# Patient Record
Sex: Male | Born: 1985 | Marital: Single | Smoking: Former smoker
Health system: Southern US, Community
[De-identification: ages and names within clinical notes are randomized; demographics above are authoritative.]

---

## 2019-11-12 ENCOUNTER — Emergency Department (HOSPITAL_COMMUNITY): Payer: Self-pay

## 2019-11-12 ENCOUNTER — Encounter (HOSPITAL_COMMUNITY): Payer: Self-pay | Admitting: Emergency Medicine

## 2019-11-12 ENCOUNTER — Other Ambulatory Visit: Payer: Self-pay

## 2019-11-12 ENCOUNTER — Inpatient Hospital Stay (HOSPITAL_COMMUNITY)
Admission: EM | Admit: 2019-11-12 | Discharge: 2019-11-16 | DRG: 060 | Disposition: A | Discharge status: Home / Self Care | Payer: Self-pay | Attending: Internal Medicine | Admitting: Internal Medicine

## 2019-11-12 DIAGNOSIS — R200 Anesthesia of skin: Secondary | ICD-10-CM

## 2019-11-12 DIAGNOSIS — R270 Ataxia, unspecified: Secondary | ICD-10-CM

## 2019-11-12 DIAGNOSIS — G35 Multiple sclerosis: Principal | ICD-10-CM | POA: Diagnosis present

## 2019-11-12 DIAGNOSIS — Z833 Family history of diabetes mellitus: Secondary | ICD-10-CM

## 2019-11-12 DIAGNOSIS — D72829 Elevated white blood cell count, unspecified: Secondary | ICD-10-CM

## 2019-11-12 DIAGNOSIS — H539 Unspecified visual disturbance: Secondary | ICD-10-CM

## 2019-11-12 DIAGNOSIS — F17210 Nicotine dependence, cigarettes, uncomplicated: Secondary | ICD-10-CM | POA: Diagnosis present

## 2019-11-12 DIAGNOSIS — R001 Bradycardia, unspecified: Secondary | ICD-10-CM | POA: Diagnosis not present

## 2019-11-12 DIAGNOSIS — H5500 Unspecified nystagmus: Secondary | ICD-10-CM | POA: Diagnosis present

## 2019-11-12 DIAGNOSIS — Z20822 Contact with and (suspected) exposure to covid-19: Secondary | ICD-10-CM | POA: Diagnosis present

## 2019-11-12 DIAGNOSIS — R739 Hyperglycemia, unspecified: Secondary | ICD-10-CM

## 2019-11-12 DIAGNOSIS — Z720 Tobacco use: Secondary | ICD-10-CM

## 2019-11-12 DIAGNOSIS — T380X5A Adverse effect of glucocorticoids and synthetic analogues, initial encounter: Secondary | ICD-10-CM | POA: Diagnosis present

## 2019-11-12 LAB — URINALYSIS, ROUTINE W REFLEX MICROSCOPIC
Bilirubin Urine: NEGATIVE
Glucose, UA: NEGATIVE mg/dL
Hgb urine dipstick: NEGATIVE
Ketones, ur: NEGATIVE mg/dL
Leukocytes,Ua: NEGATIVE
Nitrite: NEGATIVE
Protein, ur: NEGATIVE mg/dL
Specific Gravity, Urine: 1.027 (ref 1.005–1.030)
pH: 6 (ref 5.0–8.0)

## 2019-11-12 LAB — CBC
HCT: 53.8 % — ABNORMAL HIGH (ref 39.0–52.0)
Hemoglobin: 17.5 g/dL — ABNORMAL HIGH (ref 13.0–17.0)
MCH: 29.3 pg (ref 26.0–34.0)
MCHC: 32.5 g/dL (ref 30.0–36.0)
MCV: 90.1 fL (ref 80.0–100.0)
Platelets: 166 10*3/uL (ref 150–400)
RBC: 5.97 MIL/uL — ABNORMAL HIGH (ref 4.22–5.81)
RDW: 14.1 % (ref 11.5–15.5)
WBC: 12.3 10*3/uL — ABNORMAL HIGH (ref 4.0–10.5)
nRBC: 0 % (ref 0.0–0.2)

## 2019-11-12 LAB — BASIC METABOLIC PANEL
Anion gap: 10 (ref 5–15)
BUN: 20 mg/dL (ref 6–20)
CO2: 23 mmol/L (ref 22–32)
Calcium: 9 mg/dL (ref 8.9–10.3)
Chloride: 105 mmol/L (ref 98–111)
Creatinine, Ser: 0.97 mg/dL (ref 0.61–1.24)
GFR calc Af Amer: >60 mL/min (ref >60)
GFR calc non Af Amer: >60 mL/min (ref >60)
Glucose, Bld: 106 mg/dL — ABNORMAL HIGH (ref 70–99)
Potassium: 4 mmol/L (ref 3.5–5.1)
Sodium: 138 mmol/L (ref 135–145)

## 2019-11-12 LAB — CBG MONITORING, ED: Glucose-Capillary: 99 mg/dL (ref 70–99)

## 2019-11-12 IMAGING — MR MR HEAD W/ CM
3 series · 30 of 48 positions shown · IV contrast (gadavist)
Comparison: Noncontrast brain MRI earlier today.

CLINICAL DATA: 33-year-old male with ataxia, visual changes, right
facial droop, numbness. Noncontrast MRI earlier today suspicious for
demyelinating disease, including abnormal signal in the dorsal
brainstem.

EXAM:
MRI HEAD WITH CONTRAST
TECHNIQUE: Multiplanar, multiecho pulse sequences of the brain and surrounding
structures were obtained with intravenous contrast.
CONTRAST:  10mL GADAVIST GADOBUTROL 1 MMOL/ML IV SOLN

[Series 12: T1 post-contrast · axial · 3.0mm · 0.45mm/px · z∈[-44,+74]mm · 11 of 47 slices shown (1 of 3)]
[im 3/47]
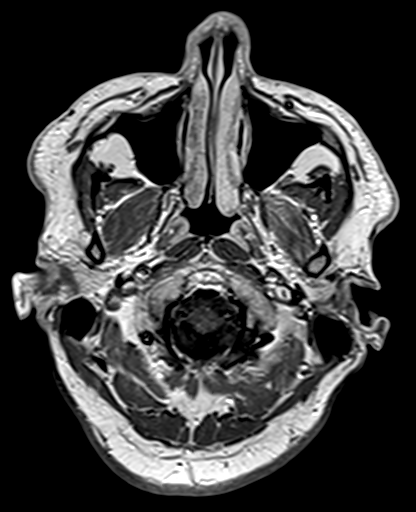
[im 7/47]
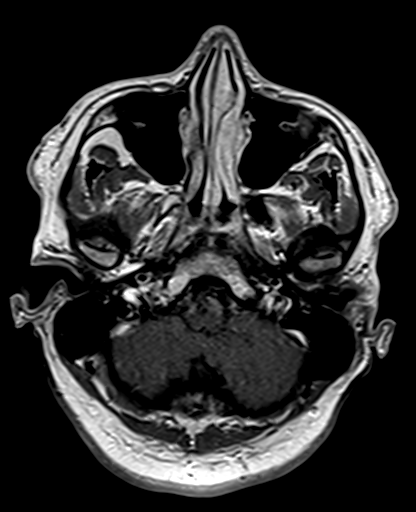
[im 9/47]
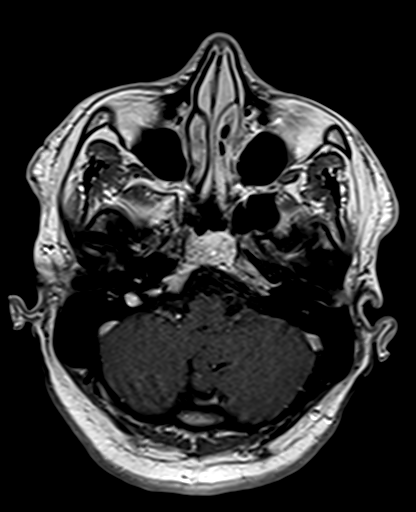
[im 15/47]
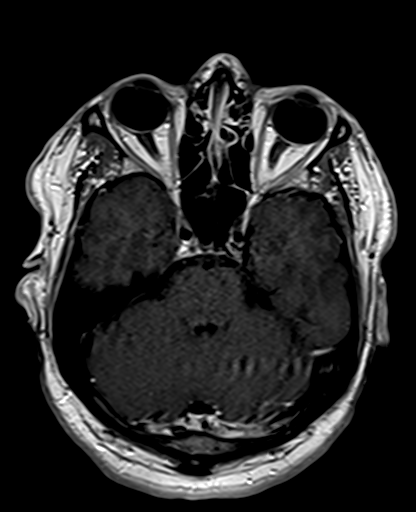
[im 21/47]
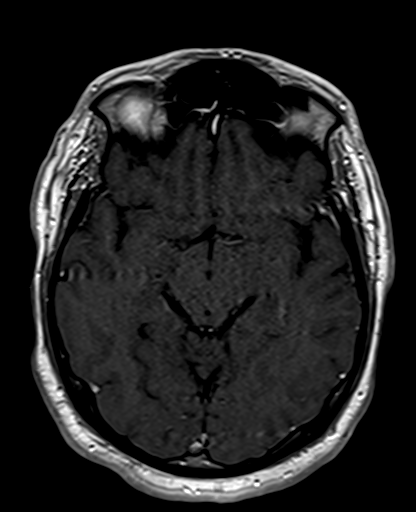
[im 24/47]
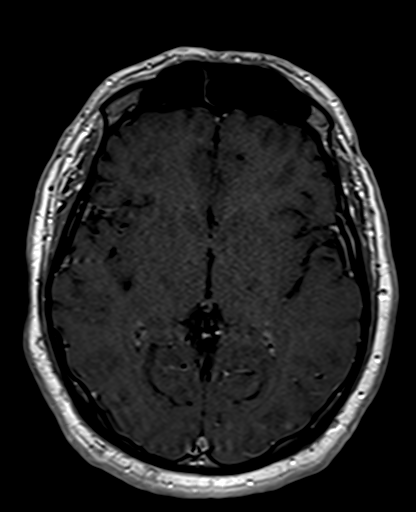
[im 26/47]
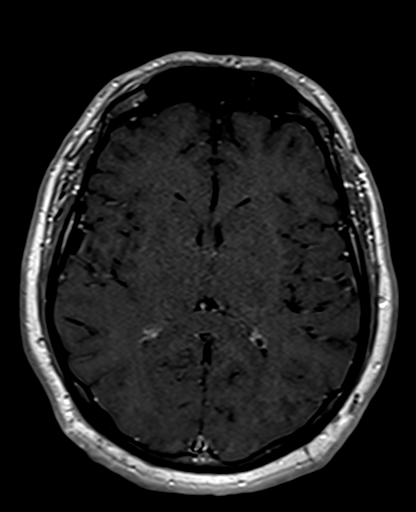
[im 32/47]
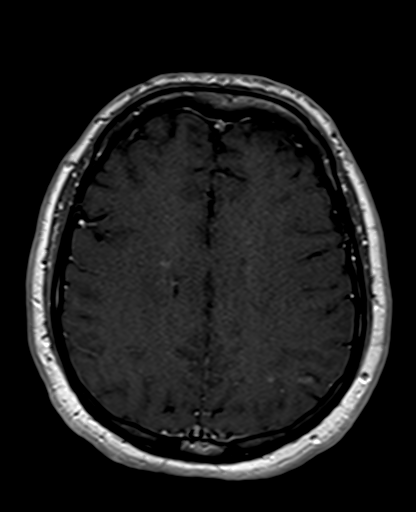
[im 38/47]
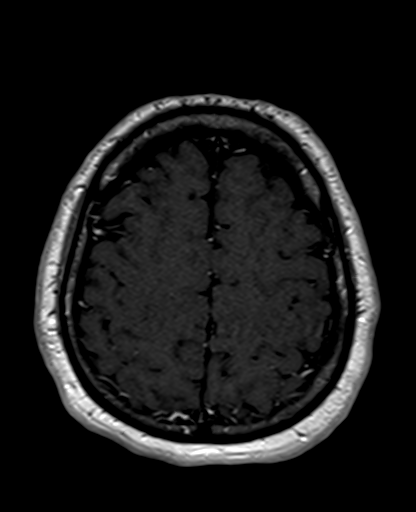
[im 40/47]
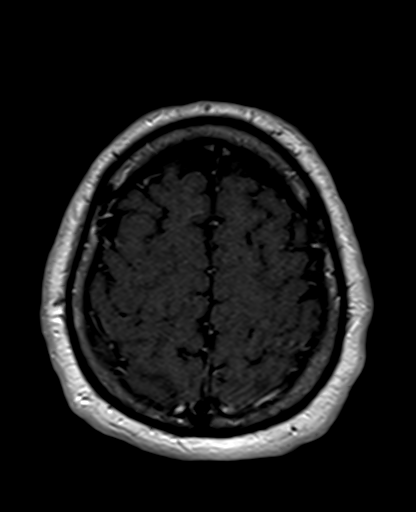
[im 44/47]
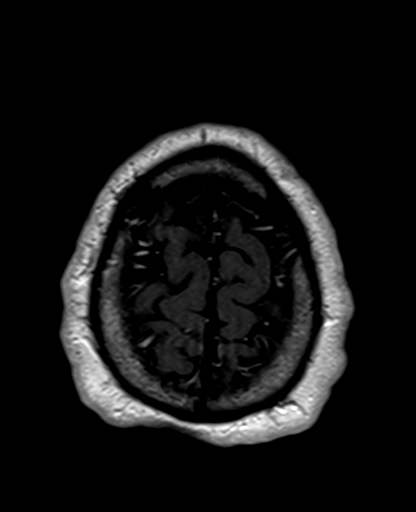

[Series 13: T1 post-contrast · coronal · 5.0mm · 0.43mm/px · 8 of 30 slices shown (2 of 3)]
[im 1/30]
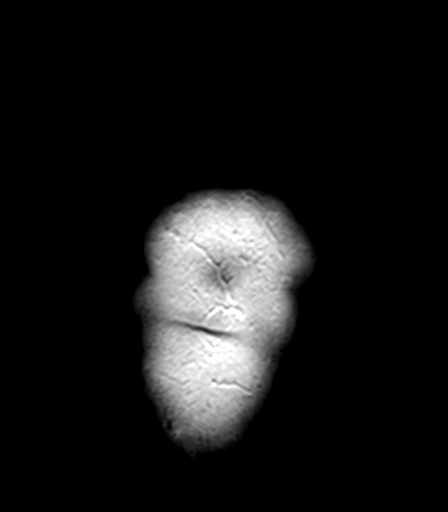
[im 5/30]
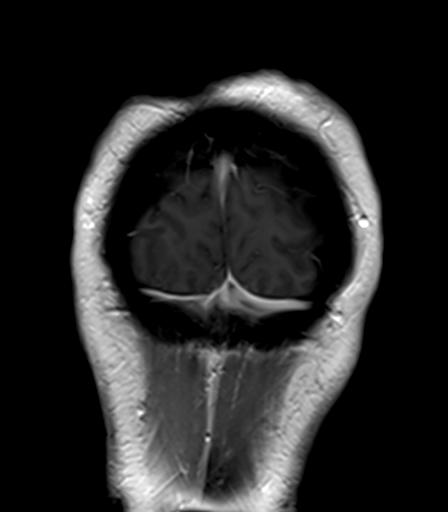
[im 9/30]
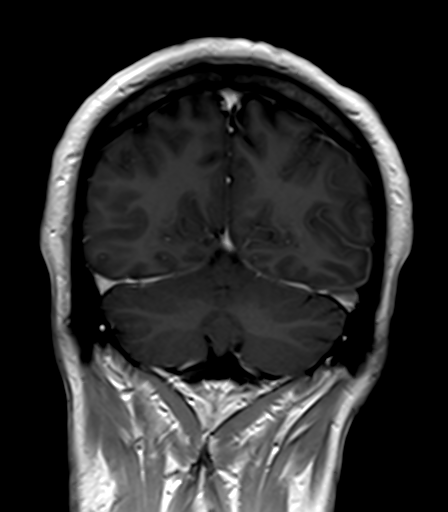
[im 14/30]
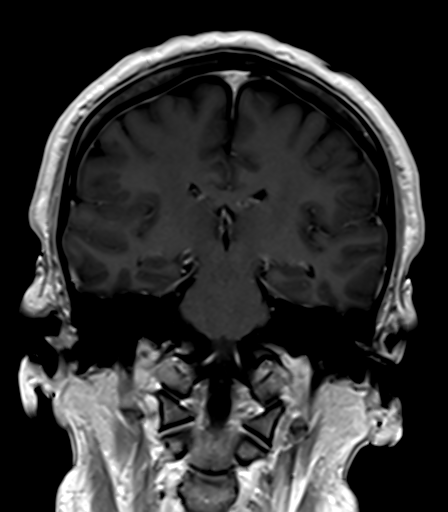
[im 16/30]
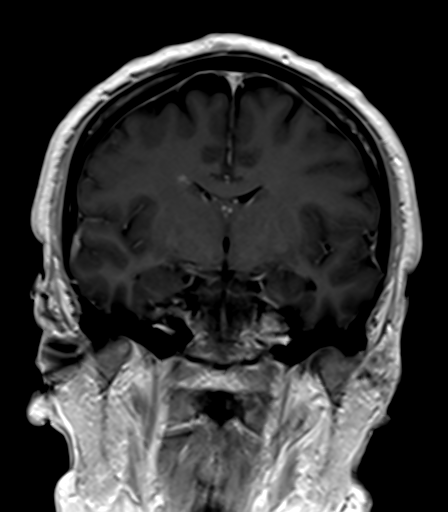
[im 21/30]
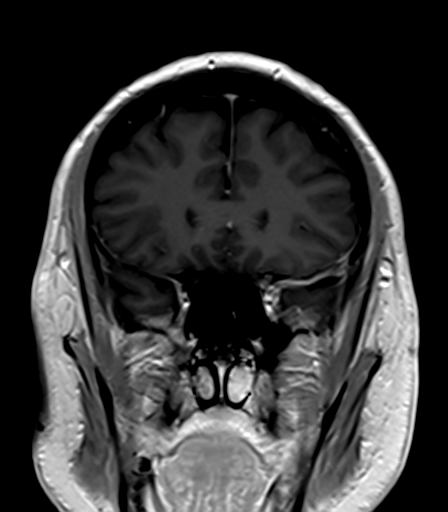
[im 25/30]
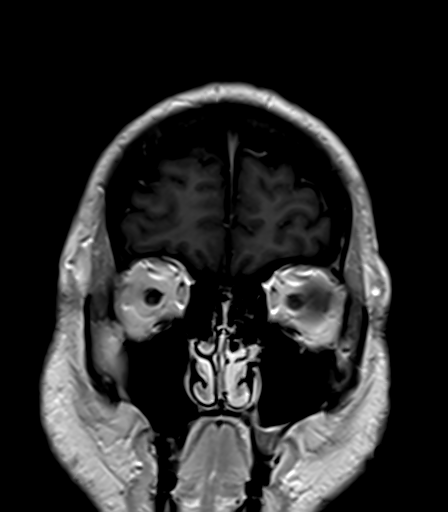
[im 30/30]
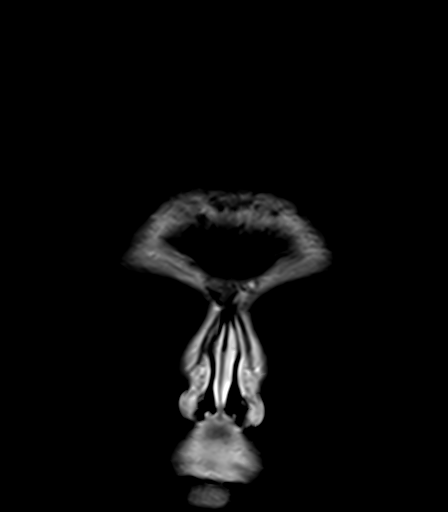

[Series 14: T1 post-contrast · sagittal · 5.0mm · 0.94mm/px · 11 of 24 slices shown (3 of 3)]
[im 1/24]
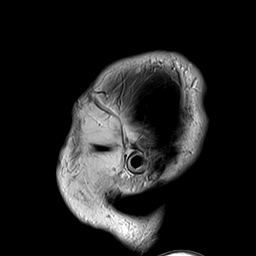
[im 3/24]
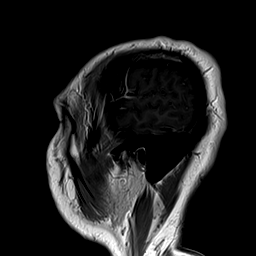
[im 5/24]
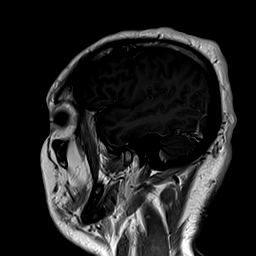
[im 7/24]
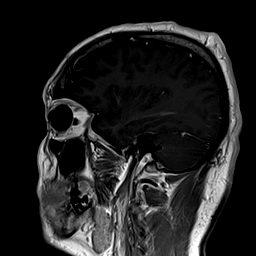
[im 10/24]
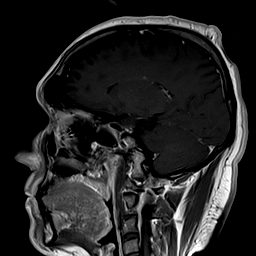
[im 12/24]
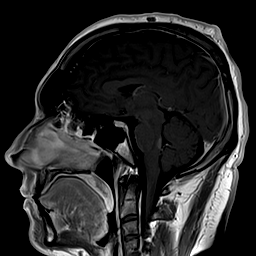
[im 14/24]
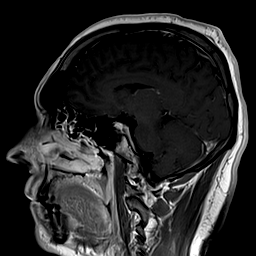
[im 17/24]
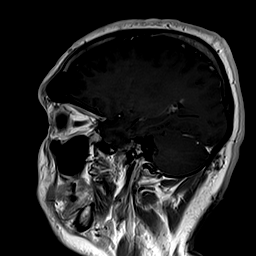
[im 19/24]
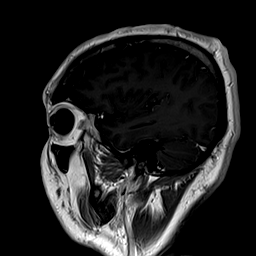
[im 21/24]
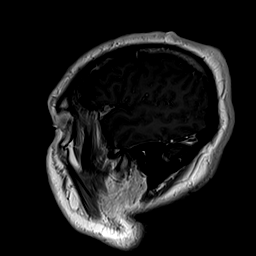
[im 24/24]
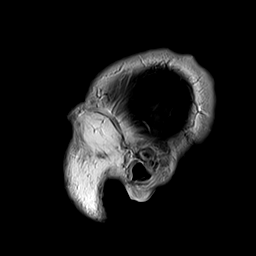

[30 of 48 positions shown; findings below may reference images not displayed]

FINDINGS: Stable cerebral morphology from earlier with no intracranial mass
effect or ventriculomegaly.

There are several small and indistinct foci of cerebral white matter
enhancement identified. A 4 mm area of enhancement at the right
corona radiata is seen on series 12, image 33. And two similar areas
of enhancement are noted in the left periatrial white matter on
series 14, images 17 and 18.

Additionally, there is patchy solid versus discontinuous enhancement
in the right dorsal brainstem lesion (series 14, image 12).

No superimposed dural thickening.

Orbit and cervical spine details are reported separately.
IMPRESSION: 1. Several small and indistinct foci of enhancement corresponding to
some of the cerebral white matter lesions and the right dorsal
brainstem lesion demonstrated earlier today.
This constellation is most suggestive of acute on chronic
demyelinating disease.
2. Orbit and cervical MRI reported separately.

## 2019-11-12 IMAGING — MR MR ORBITS WO/W CM
7 series · 46 of 48 positions shown · IV contrast (gadavist)
Comparison: Brain MRI today.

CLINICAL DATA: 33-year-old male with ataxia, visual changes, right
facial droop, numbness. Noncontrast MRI earlier today suspicious for
demyelinating disease, including abnormal signal in the dorsal
brainstem.

EXAM:
MRI OF THE ORBITS WITHOUT AND WITH CONTRAST
TECHNIQUE: Multiplanar, multisequence MR imaging of the orbits was performed
both before and after the administration of intravenous contrast.
CONTRAST:  10mL GADAVIST GADOBUTROL 1 MMOL/ML IV SOLN in conjunction
with contrast enhanced imaging of the brain and cervical spine
reported separately.

[Series 5: T1 · sagittal · 5.0mm · 0.75mm/px · 6 of 24 slices shown (1 of 3)]
[im 1/24]
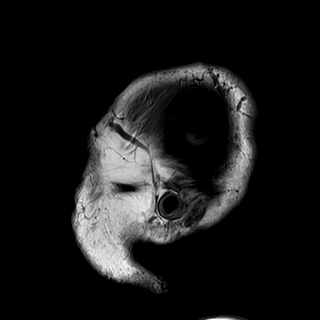
[im 5/24]
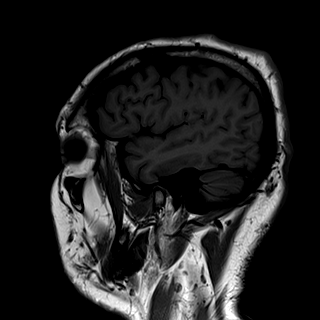
[im 10/24]
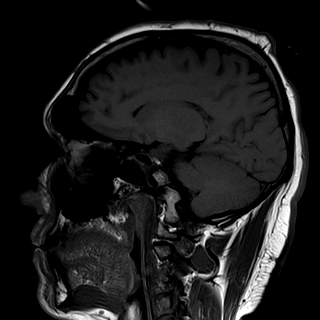
[im 14/24]
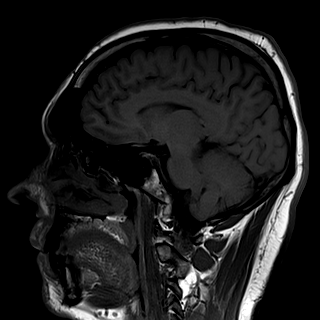
[im 19/24]
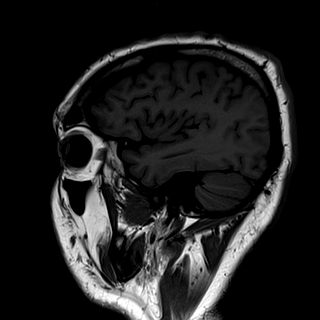
[im 24/24]
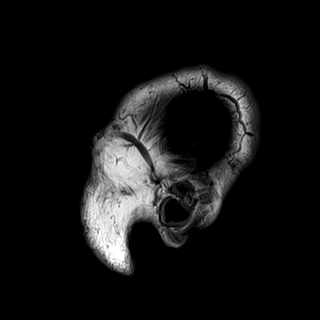

[Series 6: T2 fat-sat · axial · 3.0mm · 0.47mm/px · z∈[-51,+10]mm · 5 of 20 slices shown (1 of 2)]
[im 1/20]
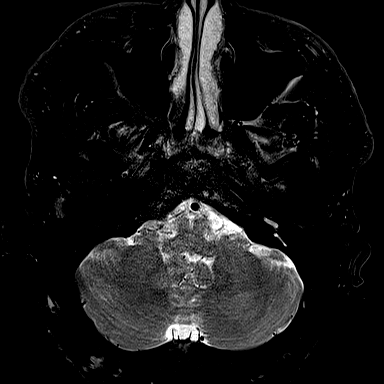
[im 5/20]
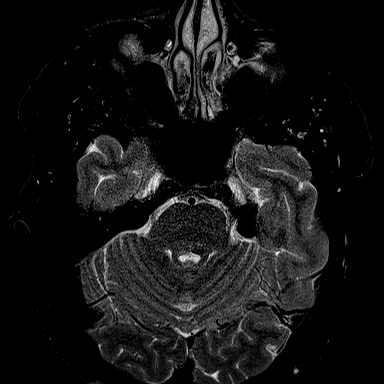
[im 10/20]
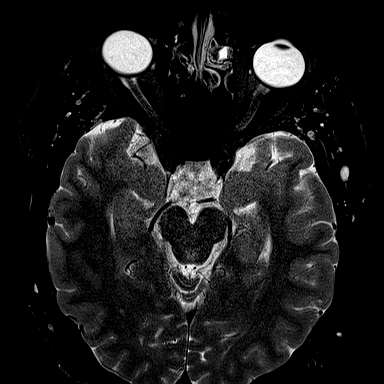
[im 15/20]
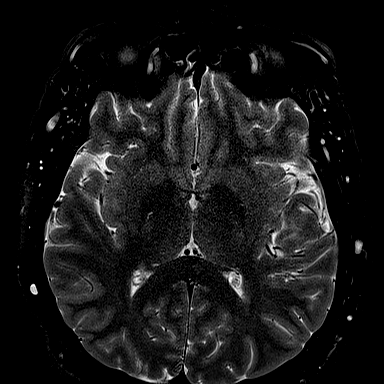
[im 20/20]
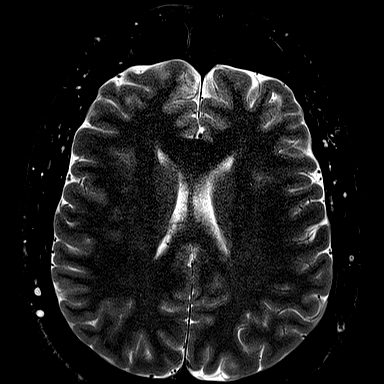

[Series 7: T1 · axial · 3.0mm · 0.56mm/px · z∈[-51,+10]mm · 5 of 20 slices shown (2 of 3)]
[im 1/20]
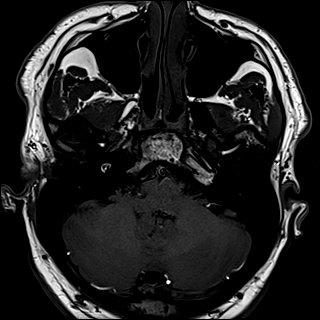
[im 5/20]
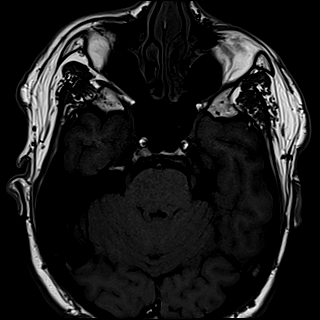
[im 10/20]
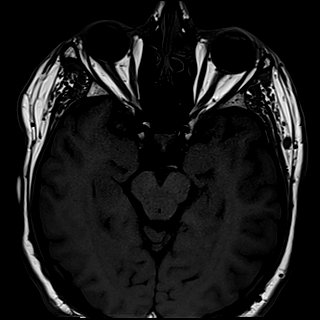
[im 15/20]
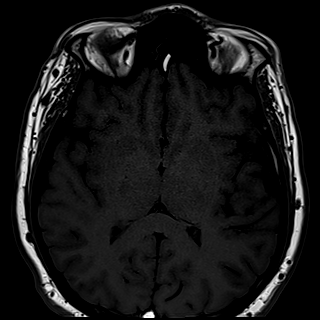
[im 20/20]
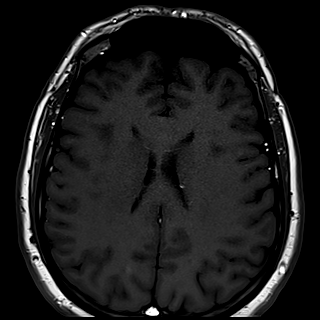

[Series 8: T2 fat-sat · coronal · 3.0mm · 0.47mm/px · 9 of 32 slices shown (2 of 2)]
[im 1/32]
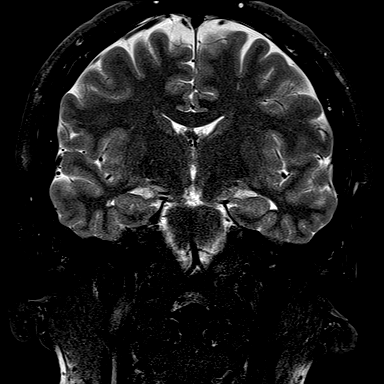
[im 4/32]
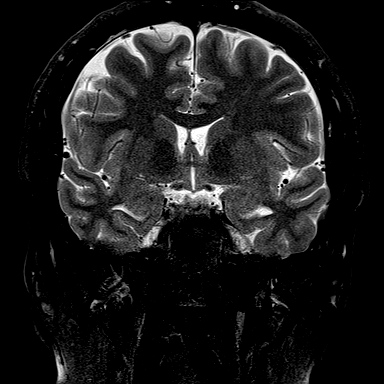
[im 8/32]
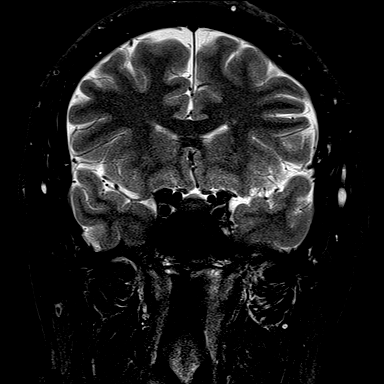
[im 12/32]
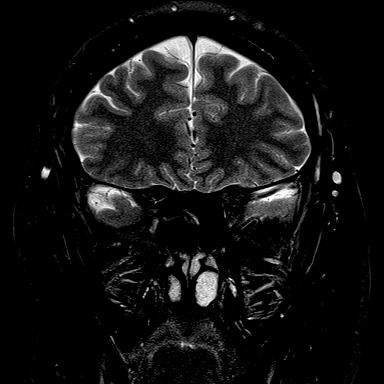
[im 16/32]
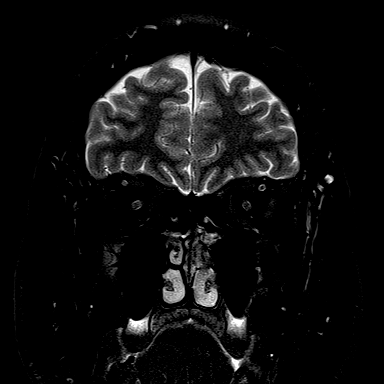
[im 20/32]
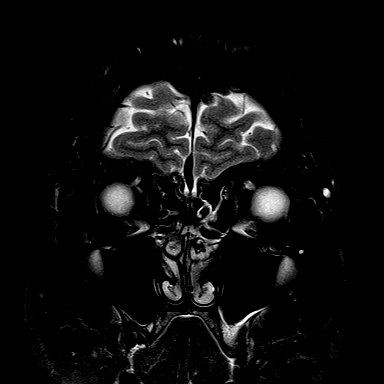
[im 24/32]
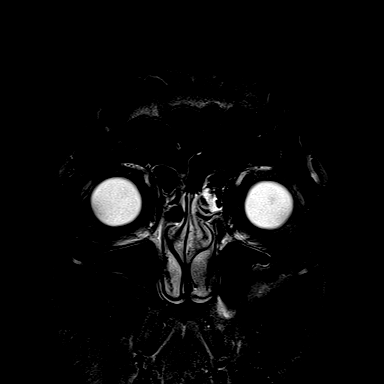
[im 28/32]
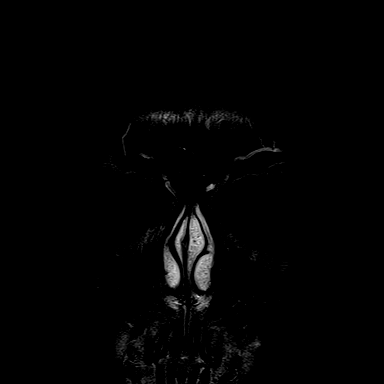
[im 32/32]
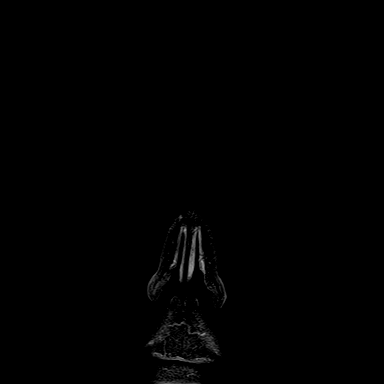

[Series 9: T1 · coronal · 3.0mm · 0.70mm/px · 8 of 32 slices shown (3 of 3)]
[im 1/32]
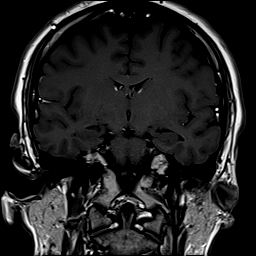
[im 4/32]
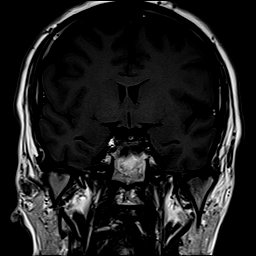
[im 8/32]
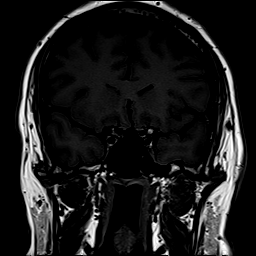
[im 12/32]
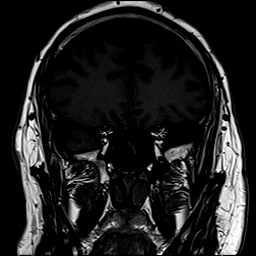
[im 20/32]
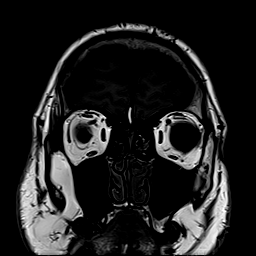
[im 24/32]
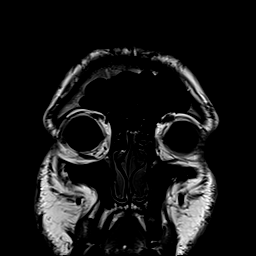
[im 28/32]
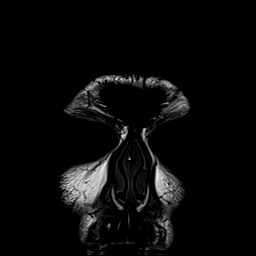
[im 32/32]
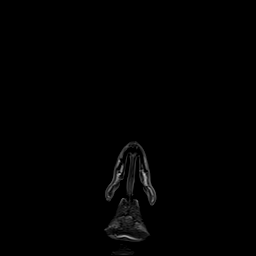

[Series 10: T1 fat-sat post-contrast · axial · 3.0mm · 0.70mm/px · z∈[-52,+10]mm · 5 of 20 slices shown (1 of 2)]
[im 1/20]
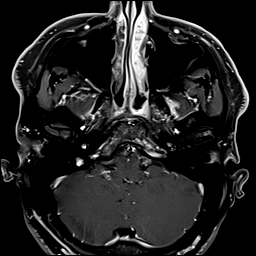
[im 5/20]
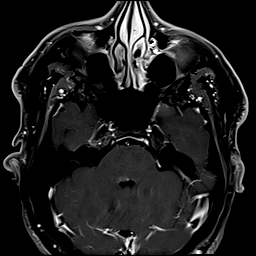
[im 10/20]
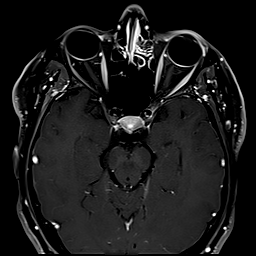
[im 15/20]
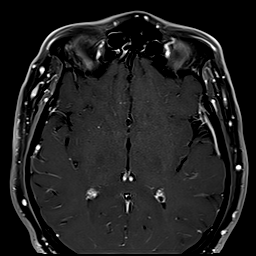
[im 20/20]
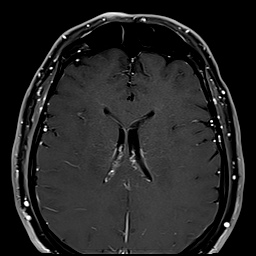

[Series 11: T1 fat-sat post-contrast · coronal · 3.0mm · 0.70mm/px · 8 of 32 slices shown (2 of 2)]
[im 1/32]
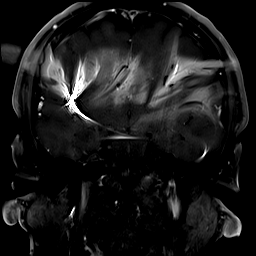
[im 4/32]
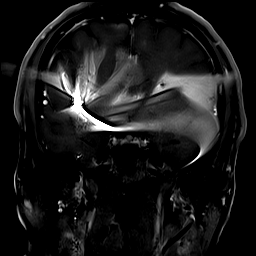
[im 8/32]
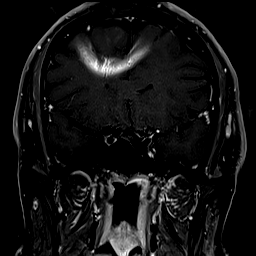
[im 12/32]
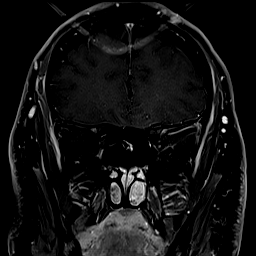
[im 20/32]
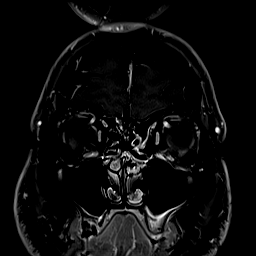
[im 24/32]
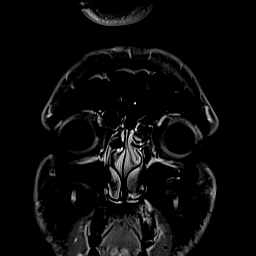
[im 28/32]
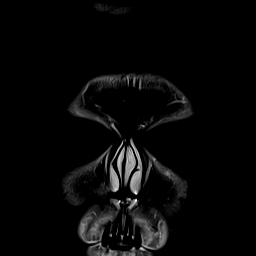
[im 32/32]
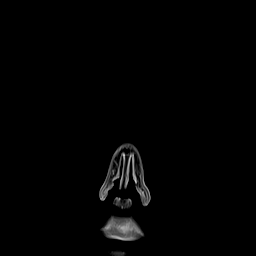

[46 of 48 positions shown; findings below may reference images not displayed]

FINDINGS: Normal suprasellar cistern. Pituitary is within normal limits. No
cavernous sinus abnormality identified.

Normal optic chiasm. Pre chiasmatic optic nerves appear symmetric
and within normal limits. No definite optic nerve enhancement.

There is mildly Disconjugate gaze, but otherwise the globes and
other intraorbital soft tissues appears symmetric and within normal
limits. No intraorbital mass or inflammation. Superficial
periorbital soft tissues are within normal limits.

Visible major vascular flow voids at the skull base are preserved.
Visible internal auditory structures appear normal. No abnormal IAC
enhancement. Visualized bone marrow signal is within normal limits.

Mildly nodular enhancement at the dorsal right brainstem lesion
again noted on series 10, image 4.
IMPRESSION: 1. MRI appearance of both orbits within normal limits.
2. Abnormal enhancement of the brain parenchyma, see head MRI with
contrast reported separately.

## 2019-11-12 IMAGING — MR MR CERVICAL SPINE WO/W CM
8 of 9 series · 35 of 48 positions shown · IV contrast (gadavist)
Comparison: Brain MRI earlier today.

CLINICAL DATA: 33-year-old male with ataxia, visual changes, right
facial droop, numbness. Noncontrast MRI earlier today suspicious for
demyelinating disease, including abnormal signal in the dorsal
brainstem.

EXAM:
MRI CERVICAL SPINE WITHOUT AND WITH CONTRAST
TECHNIQUE: Multiplanar and multiecho pulse sequences of the cervical spine, to
include the craniocervical junction and cervicothoracic junction,
were obtained without and with intravenous contrast.
CONTRAST:  10mL GADAVIST GADOBUTROL 1 MMOL/ML IV SOLN in conjunction
with contrast enhanced imaging of the brain and orbits reported
separately.

[Series 5: T1 · sagittal · 3.0mm · 0.69mm/px · 4 of 14 slices shown (1 of 2)]
[im 1/14]
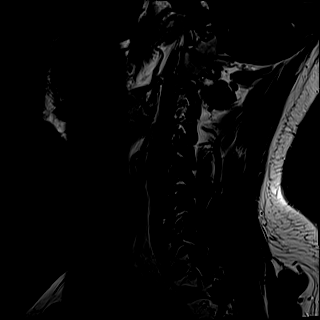
[im 5/14]
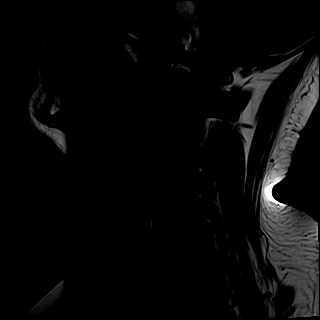
[im 9/14]
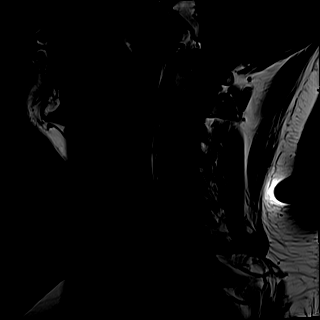
[im 14/14]
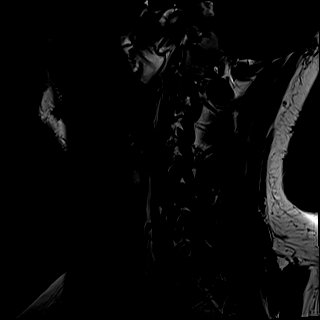

[Series 6: T2 · sagittal · 3.0mm · 0.69mm/px · 4 of 14 slices shown (1 of 2)]
[im 1/14]
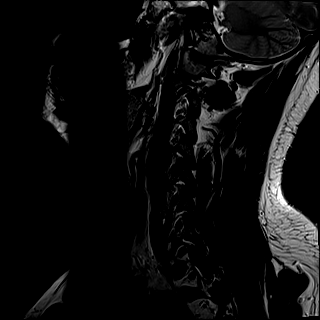
[im 5/14]
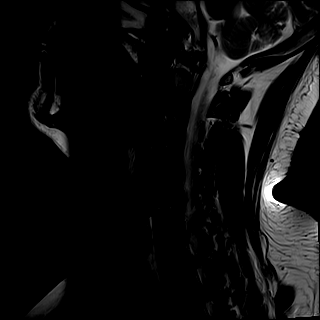
[im 9/14]
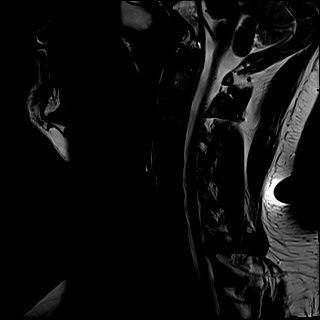
[im 14/14]
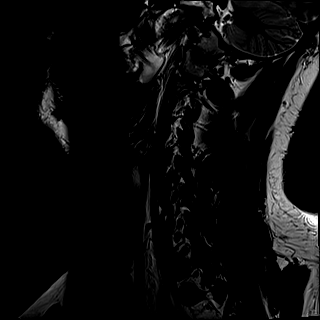

[Series 7: STIR · sagittal · 3.0mm · 0.86mm/px · 4 of 14 slices shown]
[im 1/14]
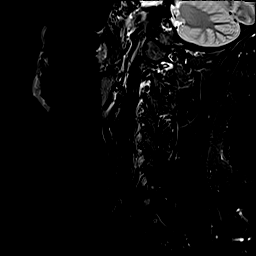
[im 5/14]
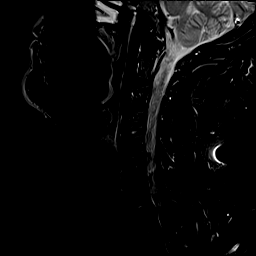
[im 9/14]
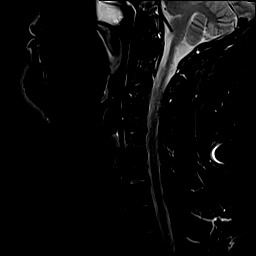
[im 14/14]
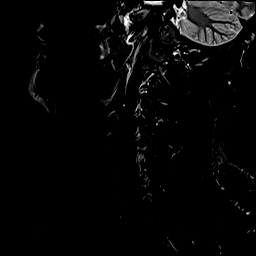

[Series 9: T1 · axial · 3.0mm · 0.35mm/px · z∈[-76,+43]mm · 7 of 32 slices shown (2 of 2)]
[im 1/32]
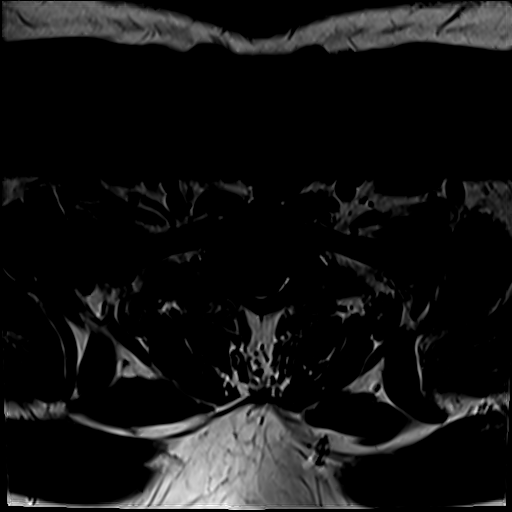
[im 6/32]
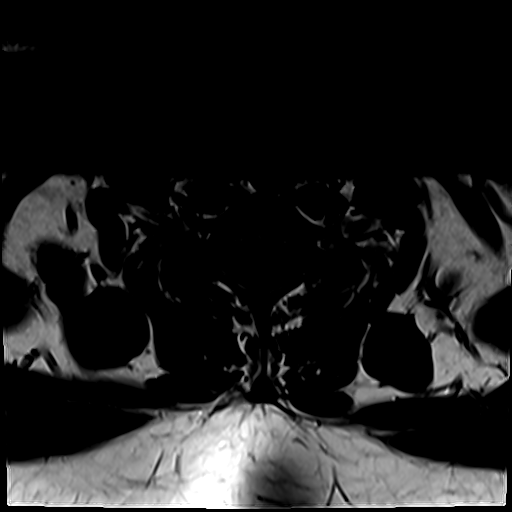
[im 11/32]
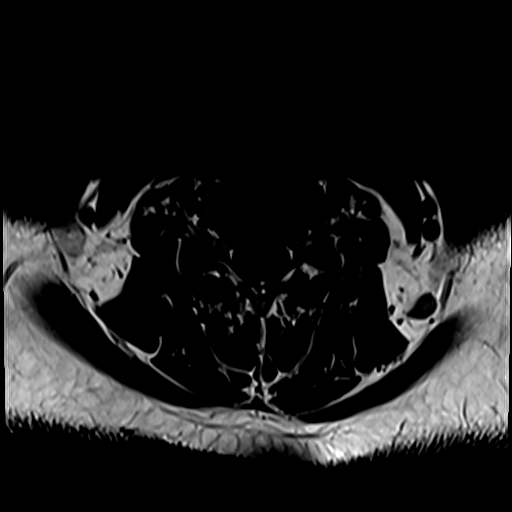
[im 16/32]
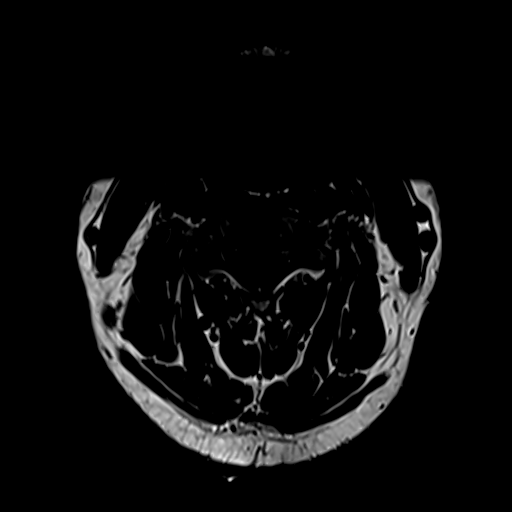
[im 21/32]
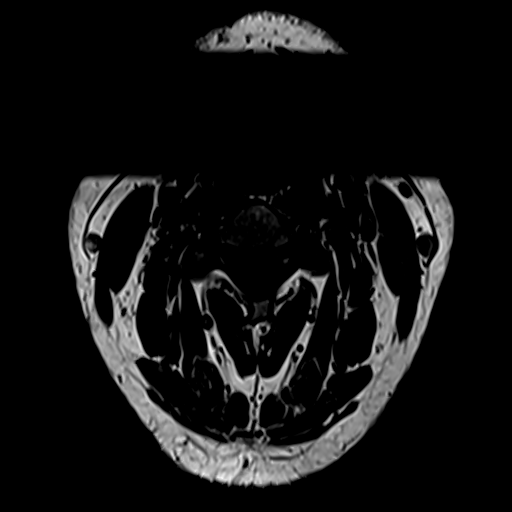
[im 26/32]
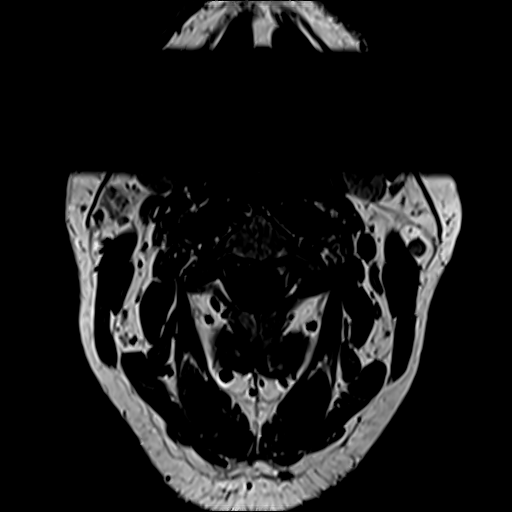
[im 32/32]
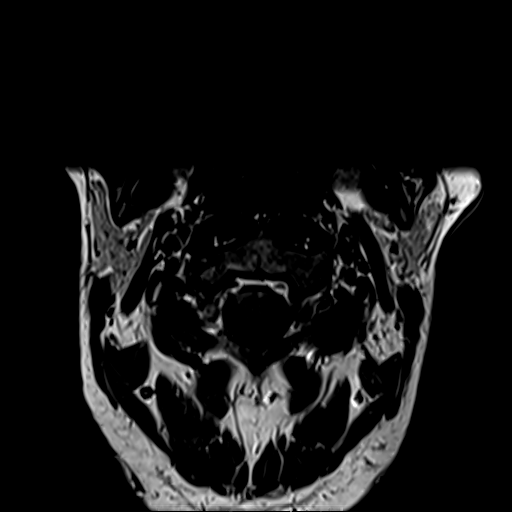

[Series 11: T2 · axial · 3.0mm · 0.70mm/px · z∈[-76,+43]mm · 7 of 32 slices shown (2 of 2)]
[im 1/32]
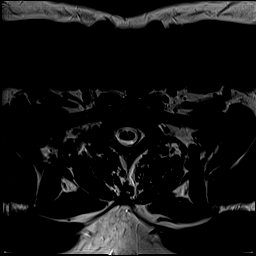
[im 6/32]
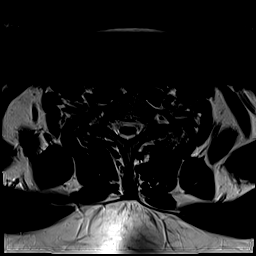
[im 11/32]
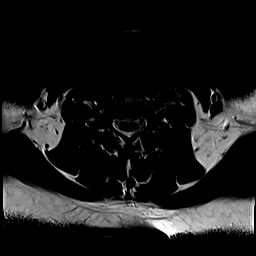
[im 16/32]
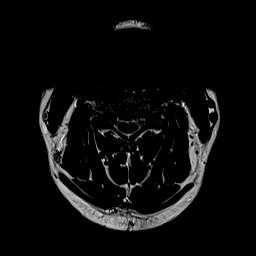
[im 21/32]
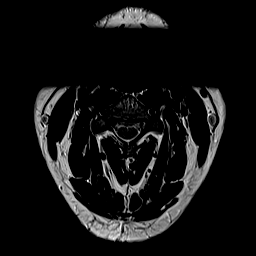
[im 26/32]
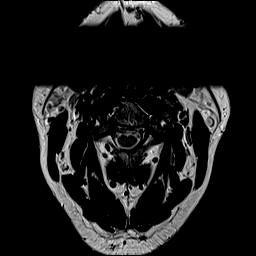
[im 32/32]
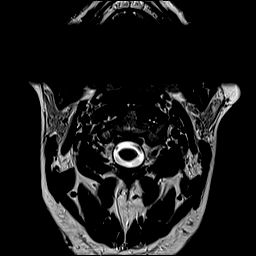

[Series 12: T2 post-contrast · sagittal · 3.0mm · 0.69mm/px · 3 of 15 slices shown]
[im 1/15]
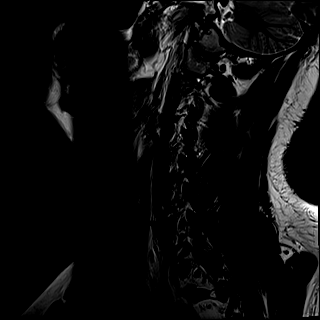
[im 8/15]
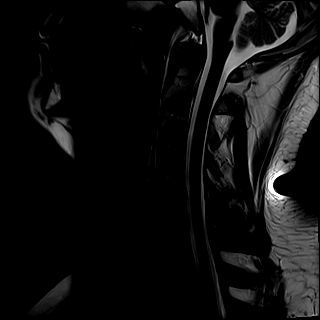
[im 15/15]
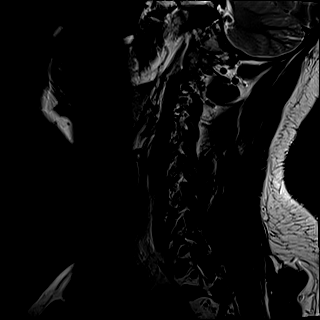

[Series 13: T1 fat-sat post-contrast · sagittal · 3.0mm · 0.86mm/px · 3 of 15 slices shown]
[im 1/15]
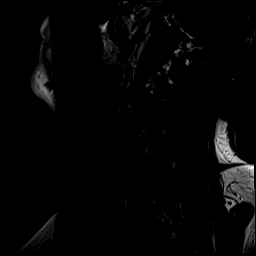
[im 8/15]
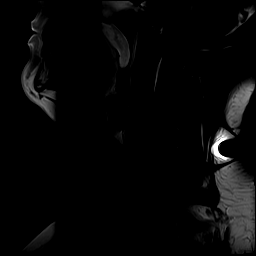
[im 15/15]
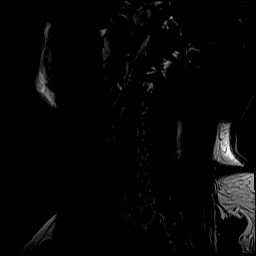

[Series 14: T1 post-contrast · axial · 3.0mm · 0.35mm/px · z∈[-245,-213]mm · 3 of 35 slices shown]
[im 1/35]
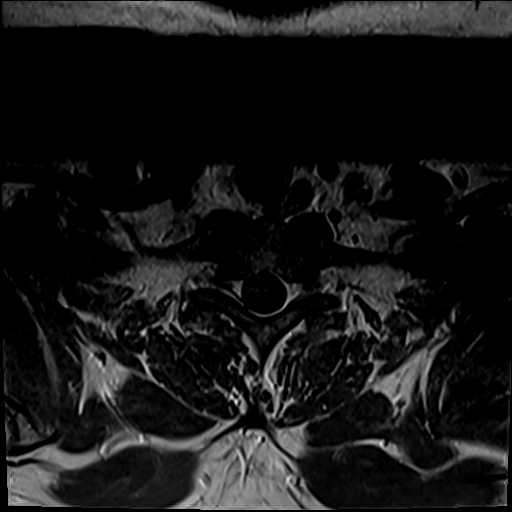
[im 5/35]
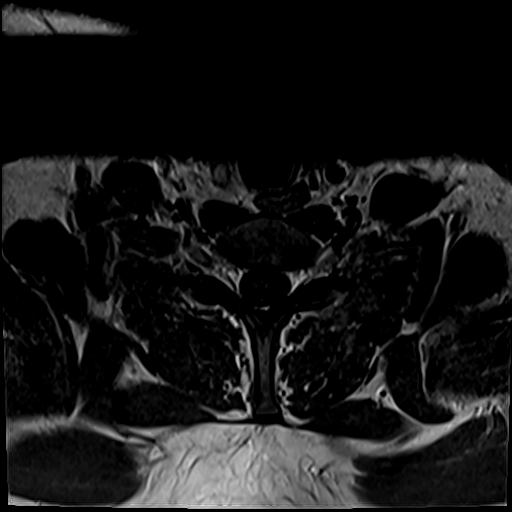
[im 10/35]
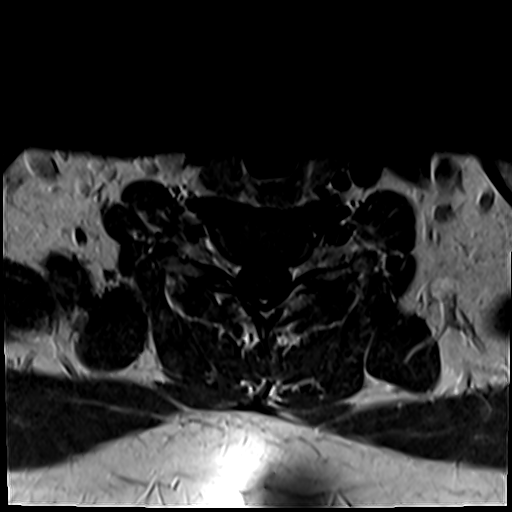

[35 of 48 positions shown; findings below may reference images not displayed]

FINDINGS: Alignment: Preserved cervical lordosis.

Vertebrae: No marrow edema or evidence of acute osseous abnormality.
Visualized bone marrow signal is within normal limits.

Cord: Normal cervical and upper thoracic spinal cord signal and
morphology. No cord demyelinating disease identified. No abnormal
intradural enhancement. No dural thickening.

Posterior Fossa, vertebral arteries, paraspinal tissues:
Cervicomedullary junction is within normal limits. Dorsal right
brainstem lesion with enhancement again noted, reported separately.

Preserved major vascular flow voids in the neck. Codominant
appearing vertebral arteries. Negative visible neck soft tissues.
Negative right lung apex.

Disc levels:

No significant degenerative changes, multilevel mild left side
cervical endplate spurring.
IMPRESSION: Normal for age MRI of the cervical spine. No evidence of cervical
spinal cord demyelination.

## 2019-11-12 IMAGING — MR MR HEAD W/O CM
9 of 10 series · 39 of 48 positions shown · non-contrast
Comparison: None.

CLINICAL DATA: Ataxia, vision change and numbness. Right facial
droop. Clinical diagnosis of Bell's palsy.

EXAM:
MRI HEAD WITHOUT CONTRAST
TECHNIQUE: Multiplanar, multiecho pulse sequences of the brain and surrounding
structures were obtained without intravenous contrast.

[Series 5: T2 · sagittal · 5.0mm · 0.47mm/px · 3 of 24 slices shown (1 of 3)]
[im 1/24]
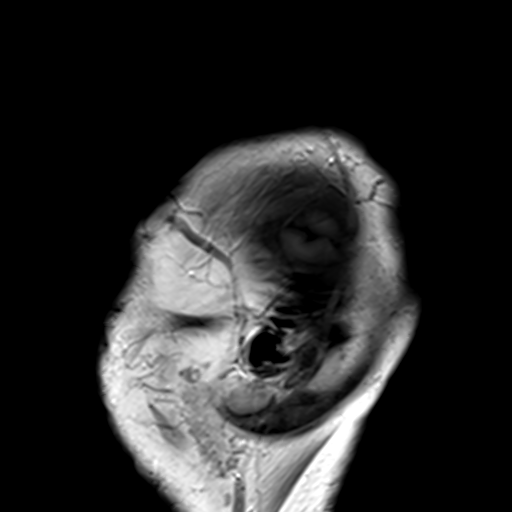
[im 12/24]
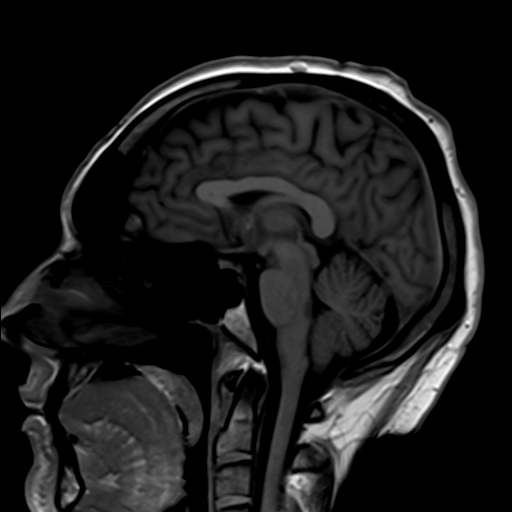
[im 24/24]
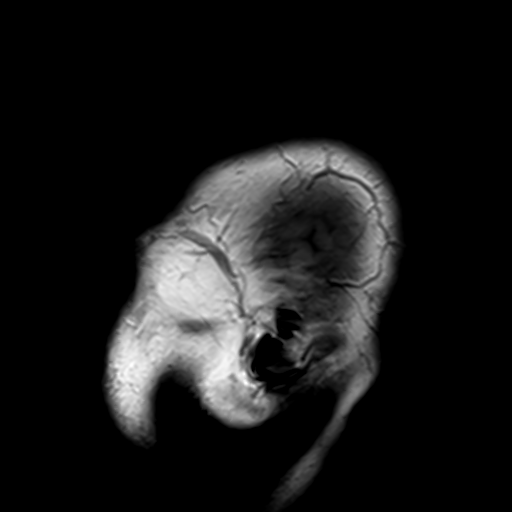

[Series 6: T2 · axial · 5.0mm · 0.45mm/px · z∈[-32,+123]mm · 3 of 25 slices shown (2 of 3)]
[im 1/25]
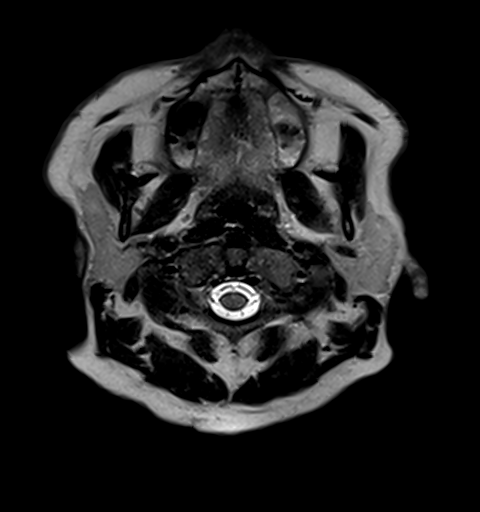
[im 13/25]
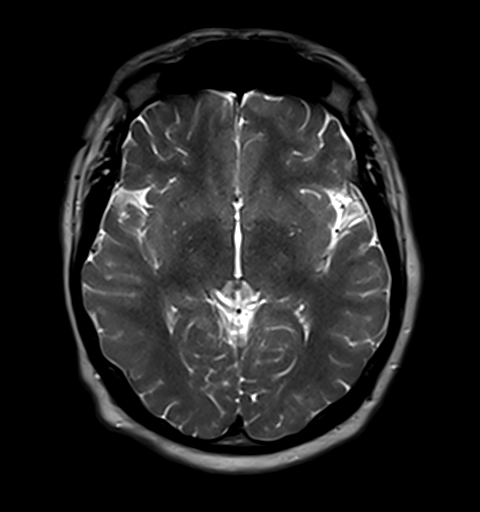
[im 25/25]
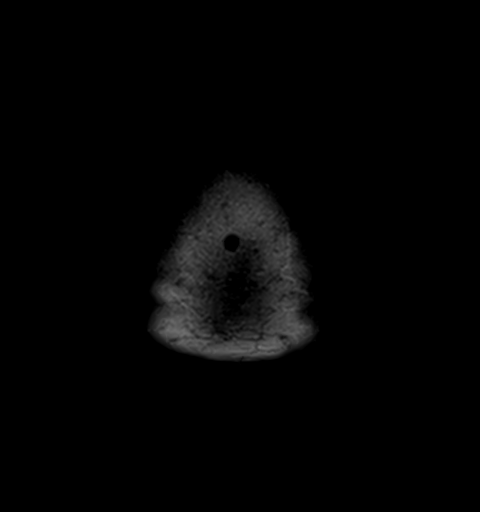

[Series 7: dwi_tracew · axial · 3.0mm · 1.08mm/px · z∈[-16,+73]mm · 6 of 92 slices shown]
[im 1/92]
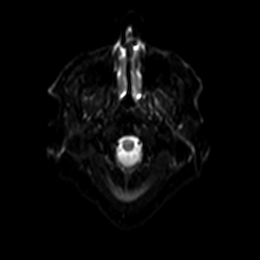
[im 11/92]
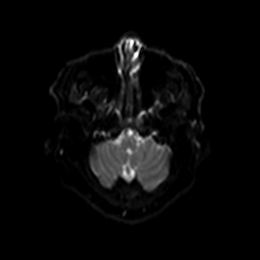
[im 31/92]
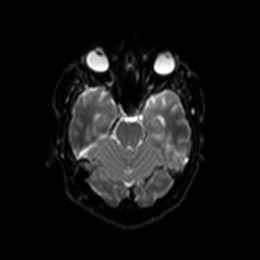
[im 41/92]
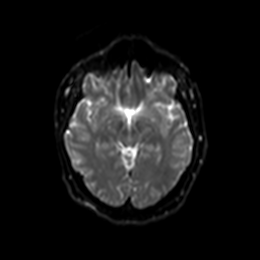
[im 51/92]
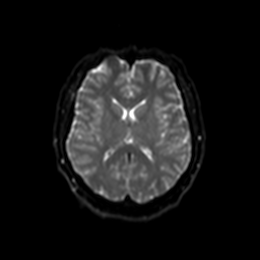
[im 61/92]
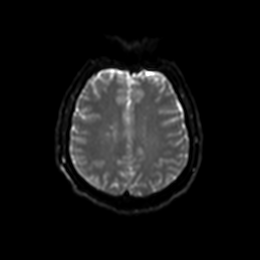

[Series 9: GRE · axial · 3.0mm · 0.45mm/px · z∈[-16,+117]mm · 5 of 46 slices shown]
[im 1/46]
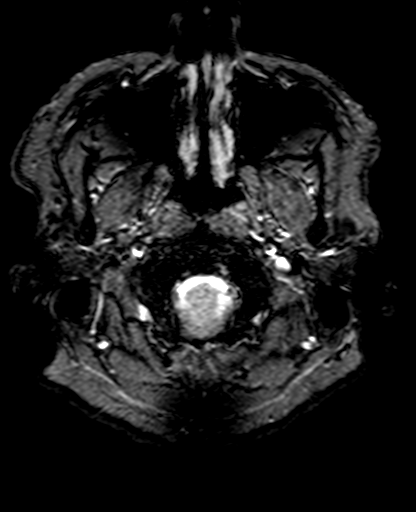
[im 12/46]
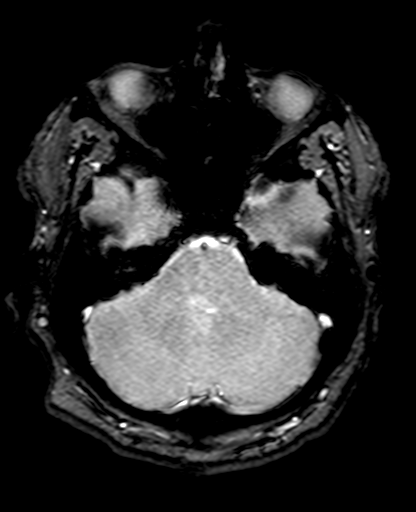
[im 23/46]
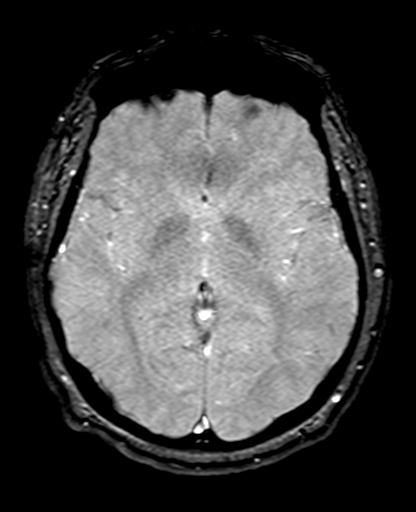
[im 34/46]
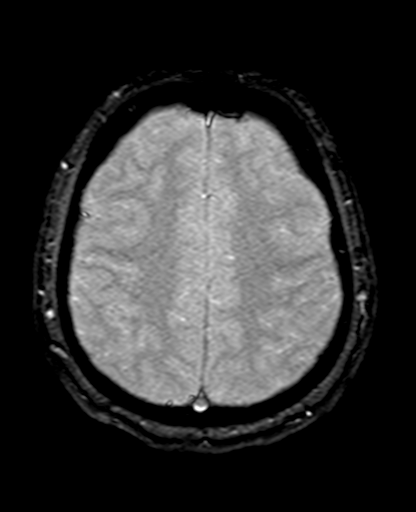
[im 46/46]
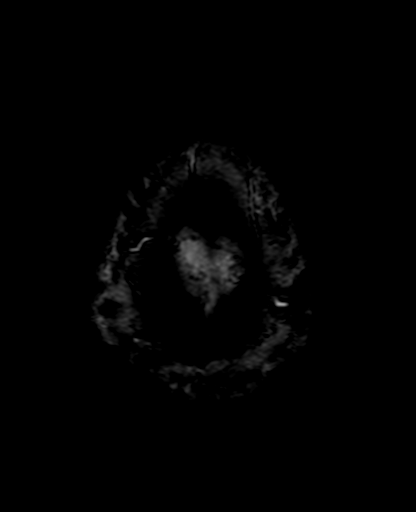

[Series 10: FLAIR · axial · 3.0mm · 0.86mm/px · z∈[-21,+113]mm · 5 of 46 slices shown]
[im 1/46]
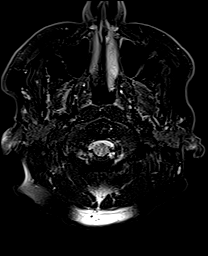
[im 12/46]
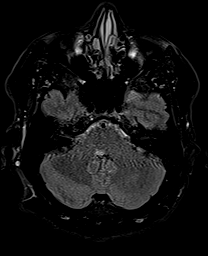
[im 23/46]
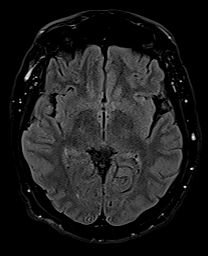
[im 34/46]
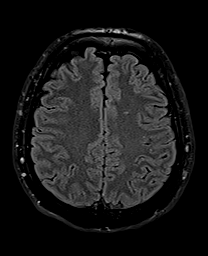
[im 46/46]
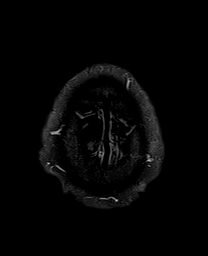

[Series 11: T1 · axial · 3.0mm · 0.45mm/px · z∈[-16,+117]mm · 5 of 46 slices shown]
[im 1/46]
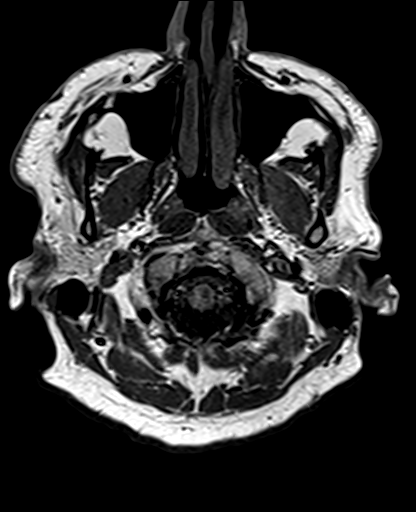
[im 12/46]
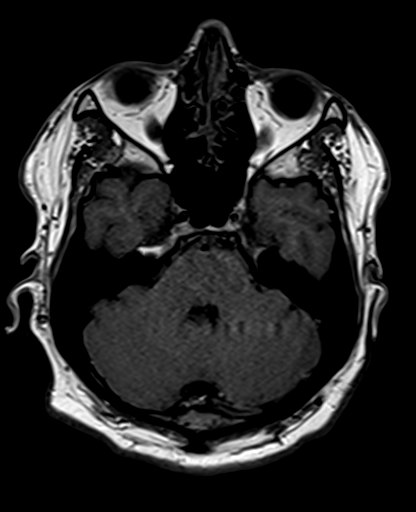
[im 23/46]
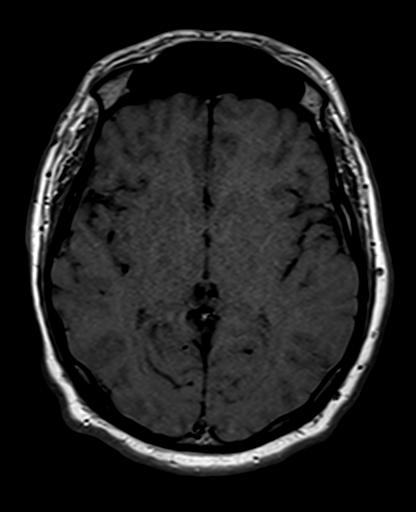
[im 34/46]
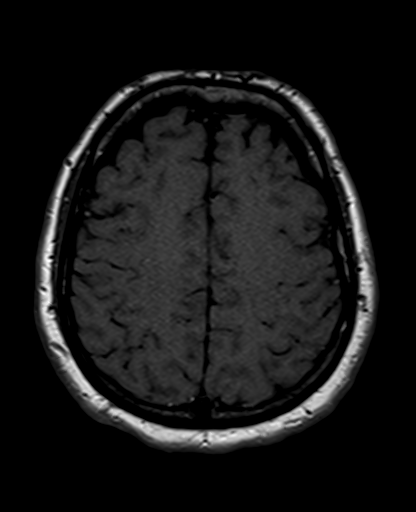
[im 46/46]
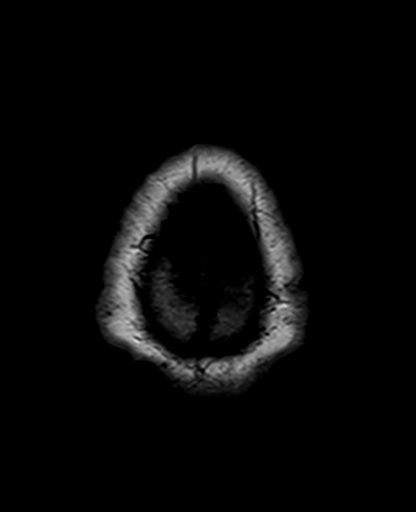

[Series 12: DWI · coronal · 5.0mm · 1.31mm/px · 6 of 52 slices shown (1 of 2)]
[im 1/52]
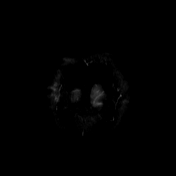
[im 11/52]
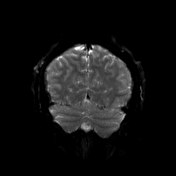
[im 21/52]
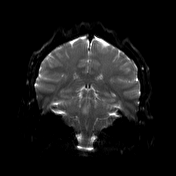
[im 31/52]
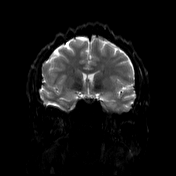
[im 41/52]
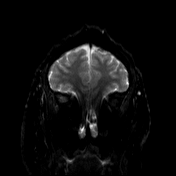
[im 52/52]
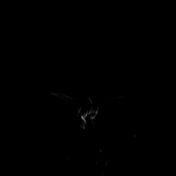

[Series 13: DWI · coronal · 5.0mm · 1.31mm/px · 3 of 26 slices shown (2 of 2)]
[im 1/26]
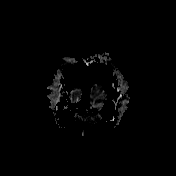
[im 13/26]
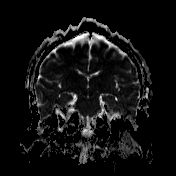
[im 26/26]
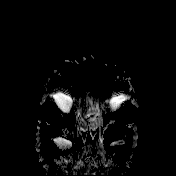

[Series 14: T2 · coronal · 5.0mm · 0.86mm/px · 3 of 26 slices shown (3 of 3)]
[im 1/26]
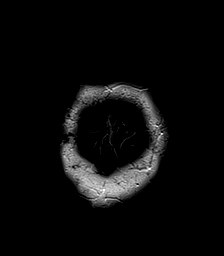
[im 13/26]
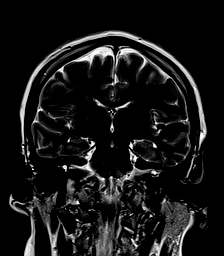
[im 26/26]
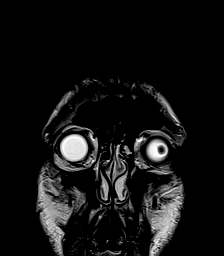

[39 of 48 positions shown; findings below may reference images not displayed]

FINDINGS: Brain: No acute infarction, hemorrhage, hydrocephalus, extra-axial
collection or mass lesion.

Focus of increased T2 signal is seen within the posterior aspect of
the pons extending to the right middle cerebellar peduncle. This
lesion involves the right facial colliculus. Other small T2
hyperintense foci are seen within the white matter of the cerebral
hemispheres, including deep, juxtacortical and periventricular white
matter.

Vascular: Normal flow voids.

Skull and upper cervical spine: Normal marrow signal.

Sinuses/Orbits: Minimal mucosal thickening of the ethmoid cells. The
orbits are maintained.
IMPRESSION: Nonspecific T2 hyperintense white matter lesions are present within
the deep, juxta cortical, and periventricular white matter as well
as in the posterior aspect of the pons involving the right facial
colliculus. Findings are suggestive of demyelinating disease.
Differential diagnosis includes inflammatory/post infectious
processes and vasculitis.

These results were called by telephone at the time of interpretation
acknowledged these results.

## 2019-11-12 MED ORDER — GADOBUTROL 1 MMOL/ML IV SOLN
10.0000 mL | Freq: Once | INTRAVENOUS | Status: AC | PRN
  Administered 2019-11-12: 10 mL via INTRAVENOUS

## 2019-11-12 MED ORDER — SODIUM CHLORIDE 0.9 % IV SOLN
1000.0000 mg | Freq: Once | INTRAVENOUS | Status: AC
  Administered 2019-11-12: 1000 mg via INTRAVENOUS
  Filled 2019-11-12 (×2): qty 8

## 2019-11-12 MED ORDER — SODIUM CHLORIDE 0.9 % IV SOLN
1000.0000 mg | Freq: Every day | INTRAVENOUS | Status: AC
  Administered 2019-11-13 – 2019-11-14 (×2): 1000 mg via INTRAVENOUS
  Filled 2019-11-12 (×2): qty 8

## 2019-11-12 NOTE — Consult Note (Signed)
Neurology Consultation Reason for Consult: Abnormal MRI Referring Physician: Stevie Kern, R  CC: Visual change  History is obtained from: Patient  HPI: Joseph Thomas is a 34 y.o. male with no past medical history who presents with 2 weeks of facial numbness/weakness, worsening gait, worsening diplopia.  He states that all of the symptoms of been present for about the same amount of time, worsening over that time.  He sought care here evaluation of the symptoms where an MRI was performed showing multiple enhancing and nonenhancing T2 lesions in multiple locations.  He denies any other recent illnesses.  He did recently change to the keto diet.   ROS: A 14 point ROS was performed and is negative except as noted in the HPI.   History reviewed. No pertinent past medical history.   Family History  Problem Relation Age of Onset  . Diabetes Father      Social History:  reports that he has been smoking cigarettes. He has a 7.00 pack-year smoking history. He does not have any smokeless tobacco history on file. He reports that he does not drink alcohol. No history on file for drug.   Exam: Current vital signs: BP 124/70   Pulse 90   Temp 98 F (36.7 C) (Oral)   Resp 15   SpO2 100%  Vital signs in last 24 hours: Temp:  [98 F (36.7 C)] 98 F (36.7 C) (03/25 0857) Pulse Rate:  [90] 90 (03/25 0857) Resp:  [15] 15 (03/25 0857) BP: (124-156)/(70-80) 124/70 (03/25 1921) SpO2:  [100 %] 100 % (03/25 0857)   Physical Exam  Constitutional: Appears well-developed and well-nourished.  Psych: Affect appropriate to situation Eyes: No scleral injection HENT: No OP obstrucion MSK: no joint deformities.  Cardiovascular: Normal rate and regular rhythm.  Respiratory: Effort normal, non-labored breathing GI: Soft.  No distension. There is no tenderness.  Skin: WDI  Neuro: Mental Status: Patient is awake, alert, oriented to person, place, month, year, and situation. Patient is able to give a  clear and coherent history. No signs of aphasia or neglect Cranial Nerves: II: Visual Fields are full. Pupils are equal, round, and reactive to light.   III,IV, VI: He has very little lateral movement of the right eye, in the left eye he has intact abduction, impaired adduction (one half syndrome) V: Facial sensation is mildly diminished in the right VII: Facial movement with mild right facial weakness.  Motor: Tone is normal. Bulk is normal. 5/5 strength was present in all four extremities.  Sensory: Sensation is symmetric to light touch and temperature in the arms and legs. Cerebellar: No clear ataxia on finger-nose-finger   I have reviewed labs in epic and the results pertinent to this consultation are: BMP-unremarkable  I have reviewed the images obtained: MRI brain-multiple T2 lesions, some of which are in the periventricular area, subcortical white matter and  brainstem  Impression: 34 year old male with vision changes and balance issues with brain imaging most consistent with multiple sclerosis.  Given the presence of enhancing and nonenhancing lesions both supra and infratentorialy, he meets McDonald criteria based on his imaging alone. Will send some autoimmune markers that can mimic MS, but  low suspicion for mimic.   Recommendations: 1) Solu-Medrol 1 g daily x3-5 days 2) PT, OT 3) ANA, SSA, SSB, ACE  Ritta Slot, MD [facility] [phone]  If 7pm- 7am, please page neurology on call as listed in AMION.

## 2019-11-12 NOTE — ED Notes (Signed)
Patient is back from MRI-reminded patient concerning UA

## 2019-11-12 NOTE — ED Notes (Signed)
Updated patient and mother with regards to second MRI-allowed patient and mother to stretch legs and get some fresh air

## 2019-11-12 NOTE — ED Triage Notes (Signed)
Per pt, states symptoms started 2 weeks ago-states ataxia, vision changes, numbness-states he went to PCP and was diagnosed with Bell's Palsy, he thinks-states they placed him on steroids and antibiotic-states symptoms have resolved other than his vision and balance-states "it looks like I am drunk when I walk"

## 2019-11-12 NOTE — ED Provider Notes (Signed)
Blood pressure (!) 156/80, pulse 90, temperature 98 F (36.7 C), temperature source Oral, resp. rate 15, SpO2 100 %.  Assuming care from Dr. Stevie Kern.  In short, Joseph Thomas is a 34 y.o. male with a chief complaint of vision changes and balance issues .  Refer to the original H&P for additional details.  The current plan of care is to f/u MRAs and consult with Neurology with suspected MS.  06:51 PM  MRI with contrast of the brain and orbits reviewed.  Concerning for acute on chronic demyelinating disease.  Discussed the images with Dr. Amada Jupiter who would like to have the patient admitted to start 1 g Solu-Medrol for 3 days and do further inpatient work-up for likely MS. I discussed this with the patient.  Neurology will consult.  Will discuss with hospitalist.   Discussed patient's case with [facility] to request admission. Patient and family (if present) updated with plan. Care transferred to [facility] service.  I reviewed all nursing notes, vitals, pertinent old records, EKGs, labs, imaging (as available).    Maia Plan, MD 11/12/19 1859

## 2019-11-12 NOTE — H&P (Signed)
History and Physical    Joseph Thomas IEP:[id number] DOB: [birth year] DOA: 11/12/2019  PCP: Hal Morales, NP  Patient coming from: Home  I have personally briefly reviewed patient's old medical records in [facility]  Chief Complaint: Vision changes and ataxia  HPI: Joseph Thomas is a 34 y.o. male with tobacco use and no other known medical problems who presents with concerns of worsening vision changes and ataxia.  About 2 weeks ago, he was just sitting in his truck when he suddenly developed right-sided facial numbness and drooping.  He then called EMS and was told to follow-up with his PCP.  He saw his PCP and was diagnosed with Bell's palsy and was started on prednisone and amoxicillin.  His facial numbness/drooping resolved with the prednisone but he also noticed progressively worsening vision changes.  His visions have been blurry, cloudy and some diplopia.  He also noticed balance issues when he was asked to do heel-to-toe at his PCPs office.  Has not had any falls.  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  He endorsed tobacco use of 1 pack/day for the past 7 to 8 years.  Denies any alcohol or illicit drug use.  Patient works as a [employer].  Denies any chemical exposures. Family history "mother, brother and sister otherwise healthy.  Dad has diabetes.  Denies any history of neurological disorders.  ED Course: He was afebrile, normotensive on room air.  CBC shows leukocytosis of 12.3, hemoglobin of 17.5.  Glucose of 106 and all other electrolytes normal.  MRI brain without contrast shows nonspecific T2 hyperintense white matter lesions. MRI brain with contrast showed several small and indistinct foci of enhancement.  MRI of both orbits within normal limits.  Normal cervical spine MRI.  ED physician discussed with neurology and he will be evaluated by them at bedside tonight and will start high dose steroids x 3  days.  Review of Systems:  Constitutional: No Weight Change, No Fever ENT/Mouth: No sore throat, No Rhinorrhea Eyes: No Eye Pain, + Vision Changes Cardiovascular: No Chest Pain, no SOB Respiratory: No Cough, No Sputum, No Wheezing, no Dyspnea  Gastrointestinal: No Nausea, No Vomiting, No Diarrhea, No Constipation, No Pain Genitourinary: no Urinary Incontinence, No Urgency, No Flank Pain Musculoskeletal: No Arthralgias, No Myalgias Skin: No Skin Lesions, No Pruritus, Neuro: no Weakness, No Numbness,  No Loss of Consciousness, No Syncope Psych: No Anxiety/Panic, No Depression, no decrease appetite Heme/Lymph: No Bruising, No Bleeding    Family History  Problem Relation Age of Onset   Diabetes Father      Prior to Admission medications   Not on File    Physical Exam: Vitals:   11/12/19 0857 11/12/19 1921  BP: (!) 156/80 124/70  Pulse: 90   Resp: 15   Temp: 98 F (36.7 C)   TempSrc: Oral   SpO2: 100%     Constitutional: NAD, calm, comfortable, young male sitting upright in chair Vitals:   11/12/19 0857 11/12/19 1921  BP: (!) 156/80 124/70  Pulse: 90   Resp: 15   Temp: 98 F (36.7 C)   TempSrc: Oral   SpO2: 100%    Eyes: PERRL, lids and conjunctivae normal, however does not seem to be able to concentrate and focus on fixed objects. ENMT: Mucous membranes are moist. Posterior pharynx clear of any exudate or lesions.Normal dentition.  Neck: normal, supple Respiratory: clear to auscultation bilaterally, no wheezing, no crackles. Normal  respiratory effort on room air. No accessory muscle use.  Cardiovascular: Regular rate and rhythm, no murmurs / rubs / gallops. No extremity edema.  Abdomen: no tenderness, no masses palpated. Bowel sounds positive.  Musculoskeletal: no clubbing / cyanosis. No joint deformity upper and lower extremities. Good ROM, no contractures. Normal muscle tone.  Skin: no rashes, lesions, ulcers. No induration Neurologic: CN 2-12 grossly  intact. Sensation intact. Strength 5/5 in all 4.  Intact finger-nose.  Intact heel-to-shin.  Negative Romberg.  Stable gait with heel walking and toe walking.  Ataxia during heel-to-toe. Psychiatric: Normal judgment and insight. Alert and oriented x 3. Normal mood.     Labs on Admission: I have personally reviewed following labs and imaging studies  CBC: Recent Labs  Lab 11/12/19 0900  WBC 12.3*  HGB 17.5*  HCT 53.8*  MCV 90.1  PLT 166   Basic Metabolic Panel: Recent Labs  Lab 11/12/19 0900  NA 138  K 4.0  CL 105  CO2 23  GLUCOSE 106*  BUN 20  CREATININE 0.97  CALCIUM 9.0   GFR: CrCl cannot be calculated (Unknown ideal weight.). Liver Function Tests: No results for input(s): AST, ALT, ALKPHOS, BILITOT, PROT, ALBUMIN in the last 168 hours. No results for input(s): LIPASE, AMYLASE in the last 168 hours. No results for input(s): AMMONIA in the last 168 hours. Coagulation Profile: No results for input(s): INR, PROTIME in the last 168 hours. Cardiac Enzymes: No results for input(s): CKTOTAL, CKMB, CKMBINDEX, TROPONINI in the last 168 hours. BNP (last 3 results) No results for input(s): PROBNP in the last 8760 hours. HbA1C: No results for input(s): HGBA1C in the last 72 hours. CBG: Recent Labs  Lab 11/12/19 0908  GLUCAP 99   Lipid Profile: No results for input(s): CHOL, HDL, LDLCALC, TRIG, CHOLHDL, LDLDIRECT in the last 72 hours. Thyroid Function Tests: No results for input(s): TSH, T4TOTAL, FREET4, T3FREE, THYROIDAB in the last 72 hours. Anemia Panel: No results for input(s): VITAMINB12, FOLATE, FERRITIN, TIBC, IRON, RETICCTPCT in the last 72 hours. Urine analysis:    Component Value Date/Time   COLORURINE AMBER (A) 11/12/2019 1128   APPEARANCEUR CLEAR 11/12/2019 1128   LABSPEC 1.027 11/12/2019 1128   PHURINE 6.0 11/12/2019 1128   GLUCOSEU NEGATIVE 11/12/2019 1128   HGBUR NEGATIVE 11/12/2019 1128   BILIRUBINUR NEGATIVE 11/12/2019 1128   KETONESUR NEGATIVE  11/12/2019 1128   PROTEINUR NEGATIVE 11/12/2019 1128   NITRITE NEGATIVE 11/12/2019 1128   LEUKOCYTESUR NEGATIVE 11/12/2019 1128    Radiological Exams on Admission: MR BRAIN WO CONTRAST  Result Date: 11/12/2019 CLINICAL DATA:  Ataxia, vision change and numbness. Right facial droop. Clinical diagnosis of Bell's palsy. EXAM: MRI HEAD WITHOUT CONTRAST TECHNIQUE: Multiplanar, multiecho pulse sequences of the brain and surrounding structures were obtained without intravenous contrast. COMPARISON:  None. FINDINGS: Brain: No acute infarction, hemorrhage, hydrocephalus, extra-axial collection or mass lesion. Focus of increased T2 signal is seen within the posterior aspect of the pons extending to the right middle cerebellar peduncle. This lesion involves the right facial colliculus. Other small T2 hyperintense foci are seen within the white matter of the cerebral hemispheres, including deep, juxtacortical and periventricular white matter. Vascular: Normal flow voids. Skull and upper cervical spine: Normal marrow signal. Sinuses/Orbits: Minimal mucosal thickening of the ethmoid cells. The orbits are maintained. IMPRESSION: Nonspecific T2 hyperintense white matter lesions are present within the deep, juxta cortical, and periventricular white matter as well as in the posterior aspect of the pons involving the right facial colliculus. Findings are  suggestive of demyelinating disease. Differential diagnosis includes inflammatory/post infectious processes and vasculitis. These results were called by telephone at the time of interpretation on 11/12/2019 at 11:33 am to provider [facility] , who verbally acknowledged these results. Electronically Signed   By: Baldemar Lenis M.D.   On: 11/12/2019 11:35   MR BRAIN W CONTRAST  Result Date: 11/12/2019 CLINICAL DATA:  34 year old male with ataxia, visual changes, right facial droop, numbness. Noncontrast MRI earlier today suspicious for demyelinating  disease, including abnormal signal in the dorsal brainstem. EXAM: MRI HEAD WITH CONTRAST TECHNIQUE: Multiplanar, multiecho pulse sequences of the brain and surrounding structures were obtained with intravenous contrast. CONTRAST:  69mL GADAVIST GADOBUTROL 1 MMOL/ML IV SOLN COMPARISON:  Noncontrast brain MRI earlier today. FINDINGS: Stable cerebral morphology from earlier with no intracranial mass effect or ventriculomegaly. There are several small and indistinct foci of cerebral white matter enhancement identified. A 4 mm area of enhancement at the right corona radiata is seen on series 12, image 33. And two similar areas of enhancement are noted in the left periatrial white matter on series 14, images 17 and 18. Additionally, there is patchy solid versus discontinuous enhancement in the right dorsal brainstem lesion (series 14, image 12). No superimposed dural thickening. Orbit and cervical spine details are reported separately. IMPRESSION: 1. Several small and indistinct foci of enhancement corresponding to some of the cerebral white matter lesions and the right dorsal brainstem lesion demonstrated earlier today. This constellation is most suggestive of acute on chronic demyelinating disease. 2. Orbit and cervical MRI reported separately. Electronically Signed   By: Odessa Fleming M.D.   On: 11/12/2019 18:38   MR Cervical Spine W or Wo Contrast  Result Date: 11/12/2019 CLINICAL DATA:  34 year old male with ataxia, visual changes, right facial droop, numbness. Noncontrast MRI earlier today suspicious for demyelinating disease, including abnormal signal in the dorsal brainstem. EXAM: MRI CERVICAL SPINE WITHOUT AND WITH CONTRAST TECHNIQUE: Multiplanar and multiecho pulse sequences of the cervical spine, to include the craniocervical junction and cervicothoracic junction, were obtained without and with intravenous contrast. CONTRAST:  49mL GADAVIST GADOBUTROL 1 MMOL/ML IV SOLN in conjunction with contrast enhanced  imaging of the brain and orbits reported separately. COMPARISON:  Brain MRI earlier today. FINDINGS: Alignment: Preserved cervical lordosis. Vertebrae: No marrow edema or evidence of acute osseous abnormality. Visualized bone marrow signal is within normal limits. Cord: Normal cervical and upper thoracic spinal cord signal and morphology. No cord demyelinating disease identified. No abnormal intradural enhancement. No dural thickening. Posterior Fossa, vertebral arteries, paraspinal tissues: Cervicomedullary junction is within normal limits. Dorsal right brainstem lesion with enhancement again noted, reported separately. Preserved major vascular flow voids in the neck. Codominant appearing vertebral arteries. Negative visible neck soft tissues. Negative right lung apex. Disc levels: No significant degenerative changes, multilevel mild left side cervical endplate spurring. IMPRESSION: Normal for age MRI of the cervical spine. No evidence of cervical spinal cord demyelination. Electronically Signed   By: Odessa Fleming M.D.   On: 11/12/2019 18:45   MR ORBITS W WO CONTRAST  Result Date: 11/12/2019 CLINICAL DATA:  34 year old male with ataxia, visual changes, right facial droop, numbness. Noncontrast MRI earlier today suspicious for demyelinating disease, including abnormal signal in the dorsal brainstem. EXAM: MRI OF THE ORBITS WITHOUT AND WITH CONTRAST TECHNIQUE: Multiplanar, multisequence MR imaging of the orbits was performed both before and after the administration of intravenous contrast. CONTRAST:  81mL GADAVIST GADOBUTROL 1 MMOL/ML IV SOLN in conjunction with contrast enhanced  imaging of the brain and cervical spine reported separately. COMPARISON:  Brain MRI today. FINDINGS: Normal suprasellar cistern. Pituitary is within normal limits. No cavernous sinus abnormality identified. Normal optic chiasm. Pre chiasmatic optic nerves appear symmetric and within normal limits. No definite optic nerve enhancement. There is  mildly Disconjugate gaze, but otherwise the globes and other intraorbital soft tissues appears symmetric and within normal limits. No intraorbital mass or inflammation. Superficial periorbital soft tissues are within normal limits. Visible major vascular flow voids at the skull base are preserved. Visible internal auditory structures appear normal. No abnormal IAC enhancement. Visualized bone marrow signal is within normal limits. Mildly nodular enhancement at the dorsal right brainstem lesion again noted on series 10, image 4. IMPRESSION: 1. MRI appearance of both orbits within normal limits. 2. Abnormal enhancement of the brain parenchyma, see head MRI with contrast reported separately. Electronically Signed   By: Genevie Ann M.D.   On: 11/12/2019 18:42    EKG: Independently reviewed.   Assessment/Plan  Demyelinating brain lesions concerning for multiple sclerosis Continue 1g solu-medrol x 3-5 days per neurology  ANA, SSA, SSB, ACE PT and OT evaluation  Mild Hyperglycemia  Check HbA1C  will add sliding scale if it starts to trend upward with steroids   Leukocytosis possible reactive. Afebrile.   Tobacco use Advise cessation  DVT prophylaxis: SCD Code Status: Full Family Communication: Plan discussed with patient and mother at bedside  disposition Plan: Home with at least 2 midnight stays  Consults called:  Admission status: inpatient   Cameron Katayama T Yifan Auker DO [facility]   If 7PM-7AM, please contact night-coverage [email or web address]   11/12/2019, 8:24 PM

## 2019-11-12 NOTE — ED Notes (Signed)
Per MRI they have 1 person ahead of him-per EDP it is ok for patient and mother to get some fresh air etc

## 2019-11-12 NOTE — ED Notes (Signed)
Off floor for testing-MRI 

## 2019-11-12 NOTE — ED Provider Notes (Signed)
[facility] DEPT Provider Note   CSN: [id number] Arrival date & time: 11/12/19  0849     History Chief Complaint  Patient presents with   vision changes   balance issues    Joseph Thomas is a 34 y.o. male.  Presents to ER with chief complaint of right-sided facial weakness, numbness, intermittent gait issues as well as vision changes.  States 2 weeks ago noted some numb feeling on his right face, also facial droop.  Went to his primary care doctor, reports concern for Bell's palsy, started on steroids and antibiotic.  Reports his white blood cell count was somewhat elevated.  Did not get any head imaging at the time.  States that the weakness and numbness seems to be improving, over this same timeframe now has feels like his vision has been worsening.  States everything just seems to be a little bit blurry.  No issues with peripheral vision.  Not worse in left or right eye.  States he has prescriptions for glasses but does not have them with him currently.    HPI     History reviewed. No pertinent past medical history.  There are no problems to display for this patient.   History reviewed. No pertinent surgical history.     No family history on file.  Social History   Tobacco Use   Smoking status: Not on file  Substance Use Topics   Alcohol use: Not on file   Drug use: Not on file    Home Medications Prior to Admission medications   Not on File    Allergies    Patient has no allergy information on record.  Review of Systems   Review of Systems  Constitutional: Negative for chills and fever.  HENT: Negative for ear pain and sore throat.   Eyes: Negative for pain and visual disturbance.  Respiratory: Negative for cough and shortness of breath.   Cardiovascular: Negative for chest pain and palpitations.  Gastrointestinal: Negative for abdominal pain and vomiting.  Genitourinary: Negative for dysuria and hematuria.    Musculoskeletal: Negative for arthralgias and back pain.  Skin: Negative for color change and rash.  Neurological: Positive for facial asymmetry, weakness and numbness. Negative for seizures, syncope and light-headedness.  All other systems reviewed and are negative.   Physical Exam Updated Vital Signs BP (!) 156/80    Pulse 90    Temp 98 F (36.7 C) (Oral)    Resp 15    SpO2 100%   Physical Exam Vitals and nursing note reviewed.  Constitutional:      Appearance: He is well-developed.  HENT:     Head: Normocephalic and atraumatic.  Eyes:     Conjunctiva/sclera: Conjunctivae normal.  Cardiovascular:     Rate and Rhythm: Normal rate and regular rhythm.     Heart sounds: No murmur.  Pulmonary:     Effort: Pulmonary effort is normal. No respiratory distress.     Breath sounds: Normal breath sounds.  Abdominal:     Palpations: Abdomen is soft.     Tenderness: There is no abdominal tenderness.  Musculoskeletal:     Cervical back: Neck supple.  Skin:    General: Skin is warm and dry.     Capillary Refill: Capillary refill takes less than 2 seconds.  Neurological:     Mental Status: He is alert.     Comments: AAOx3, speech clear, Mild right facial weakness, no forehead sparing; CN II-XII intact including visual fields, 5/5  strength in all four extremities, sensation to light touch intact in all four, normal FNF, no pronator drift, normal gait     ED Results / Procedures / Treatments   Labs (all labs ordered are listed, but only abnormal results are displayed) Labs Reviewed  BASIC METABOLIC PANEL - Abnormal; Notable for the following components:      Result Value   Glucose, Bld 106 (*)    All other components within normal limits  CBC - Abnormal; Notable for the following components:   WBC 12.3 (*)    RBC 5.97 (*)    Hemoglobin 17.5 (*)    HCT 53.8 (*)    All other components within normal limits  URINALYSIS, ROUTINE W REFLEX MICROSCOPIC - Abnormal; Notable for the  following components:   Color, Urine AMBER (*)    All other components within normal limits  CBG MONITORING, ED    EKG EKG Interpretation  Date/Time:  Thursday November 12 2019 08:57:06 EDT Ventricular Rate:  83 PR Interval:    QRS Duration: 84 QT Interval:  348 QTC Calculation: 409 R Axis:   70 Text Interpretation: Sinus rhythm Nonspecific T abnormalities, inferior leads 12 Lead; Mason-Likar Confirmed by Madalyn Rob [phone]) on 11/12/2019 9:38:22 AM   Radiology MR BRAIN WO CONTRAST  Result Date: 11/12/2019 CLINICAL DATA:  Ataxia, vision change and numbness. Right facial droop. Clinical diagnosis of Bell's palsy. EXAM: MRI HEAD WITHOUT CONTRAST TECHNIQUE: Multiplanar, multiecho pulse sequences of the brain and surrounding structures were obtained without intravenous contrast. COMPARISON:  None. FINDINGS: Brain: No acute infarction, hemorrhage, hydrocephalus, extra-axial collection or mass lesion. Focus of increased T2 signal is seen within the posterior aspect of the pons extending to the right middle cerebellar peduncle. This lesion involves the right facial colliculus. Other small T2 hyperintense foci are seen within the white matter of the cerebral hemispheres, including deep, juxtacortical and periventricular white matter. Vascular: Normal flow voids. Skull and upper cervical spine: Normal marrow signal. Sinuses/Orbits: Minimal mucosal thickening of the ethmoid cells. The orbits are maintained. IMPRESSION: Nonspecific T2 hyperintense white matter lesions are present within the deep, juxta cortical, and periventricular white matter as well as in the posterior aspect of the pons involving the right facial colliculus. Findings are suggestive of demyelinating disease. Differential diagnosis includes inflammatory/post infectious processes and vasculitis. These results were called by telephone at the time of interpretation on 11/12/2019 at 11:33 am to provider [facility] , who verbally  acknowledged these results. Electronically Signed   By: Pedro Earls M.D.   On: 11/12/2019 11:35    Procedures Procedures (including critical care time)  Medications Ordered in ED Medications - No data to display  ED Course  I have reviewed the triage vital signs and the nursing notes.  Pertinent labs & imaging results that were available during my care of the patient were reviewed by me and considered in my medical decision making (see chart for details).    MDM Rules/Calculators/A&P                      34 year old male who presents to ER with complaints of right facial weakness, numbness, intermittent vision changes and reported gait change.  Focal deficits were appreciated on my exam.  Given myriad symptoms, checked MRI to further evaluate.  MRI concerning for possible demyelinating process.  Reviewed case in detail with on-call neurology Dr. Malen Gauze.  He recommended obtaining MRI brain with contrast, C-spine with and without contrast, orbits with  and without contrast.  Neurology recommendations pending MRI results.  Wish to be contacted after MRI is done.  While awaiting MRIs and further recommendations from neurology, patient signed out to Dr. Laverta Baltimore.  Please refer to his note for final plan and disposition.   Final Clinical Impression(s) / ED Diagnoses Final diagnoses:  Right facial numbness  Visual disturbance    Rx / DC Orders ED Discharge Orders    None       Lucrezia Starch, MD 11/12/19 1705

## 2019-11-12 NOTE — Progress Notes (Signed)
ED TO INPATIENT HANDOFF REPORT  Name/Age/Gender Joseph Thomas 34 y.o. male  Code Status    Code Status Orders  (From admission, onward)         Start     Ordered   11/12/19 2022  Full code  Continuous     11/12/19 2022        Code Status History    This patient has a current code status but no historical code status.   Advance Care Planning Activity      Home/SNF/Other Home  Chief Complaint Ataxia [R27.0]  Level of Care/Admitting Diagnosis ED Disposition    ED Disposition Condition Comment   Admit  Hospital Area: [facility] [[id number]]  Level of Care: Med-Surg [16]  May admit patient to [facility] or [facility] if equivalent level of care is available:: No  Covid Evaluation: Asymptomatic Screening Protocol (No Symptoms)  Diagnosis: Ataxia [443154]  Admitting Physician: Anselm Jungling [[id number]]  Attending Physician: Anselm Jungling [[id number]]  Estimated length of stay: past midnight tomorrow  Certification:: I certify this patient will need inpatient services for at least 2 midnights       Medical History History reviewed. No pertinent past medical history.  Allergies No Known Allergies  IV Location/Drains/Wounds Patient Lines/Drains/Airways Status   Active Line/Drains/Airways    Name:   Placement date:   Placement time:   Site:   Days:   Peripheral IV 11/12/19 Right Forearm   11/12/19    1252    Forearm   less than 1          Labs/Imaging Results for orders placed or performed during the hospital encounter of 11/12/19 (from the past 48 hour(s))  Basic metabolic panel     Status: Abnormal   Collection Time: 11/12/19  9:00 AM  Result Value Ref Range   Sodium 138 135 - 145 mmol/L   Potassium 4.0 3.5 - 5.1 mmol/L   Chloride 105 98 - 111 mmol/L   CO2 23 22 - 32 mmol/L   Glucose, Bld 106 (H) 70 - 99 mg/dL    Comment: Glucose reference range applies only to samples taken after fasting for at least 8 hours.   BUN 20 6 - 20 mg/dL    Creatinine, Ser 7.12 0.61 - 1.24 mg/dL   Calcium 9.0 8.9 - 45.8 mg/dL   GFR calc non Af Amer >60 >60 mL/min   GFR calc Af Amer >60 >60 mL/min   Anion gap 10 5 - 15    Comment: Performed at [facility], [address], [place], [place] [address]  CBC     Status: Abnormal   Collection Time: 11/12/19  9:00 AM  Result Value Ref Range   WBC 12.3 (H) 4.0 - 10.5 K/uL   RBC 5.97 (H) 4.22 - 5.81 MIL/uL   Hemoglobin 17.5 (H) 13.0 - 17.0 g/dL   HCT 38.2 (H) 50.5 - 39.7 %   MCV 90.1 80.0 - 100.0 fL   MCH 29.3 26.0 - 34.0 pg   MCHC 32.5 30.0 - 36.0 g/dL   RDW 67.3 41.9 - 37.9 %   Platelets 166 150 - 400 K/uL   nRBC 0.0 0.0 - 0.2 %    Comment: Performed at [facility], [address] [place], [place] [address]  CBG monitoring, ED     Status: None   Collection Time: 11/12/19  9:08 AM  Result Value Ref Range   Glucose-Capillary 99 70 - 99  mg/dL    Comment: Glucose reference range applies only to samples taken after fasting for at least 8 hours.  Urinalysis, Routine w reflex microscopic     Status: Abnormal   Collection Time: 11/12/19 11:28 AM  Result Value Ref Range   Color, Urine AMBER (A) YELLOW    Comment: BIOCHEMICALS MAY BE AFFECTED BY COLOR   APPearance CLEAR CLEAR   Specific Gravity, Urine 1.027 1.005 - 1.030   pH 6.0 5.0 - 8.0   Glucose, UA NEGATIVE NEGATIVE mg/dL   Hgb urine dipstick NEGATIVE NEGATIVE   Bilirubin Urine NEGATIVE NEGATIVE   Ketones, ur NEGATIVE NEGATIVE mg/dL   Protein, ur NEGATIVE NEGATIVE mg/dL   Nitrite NEGATIVE NEGATIVE   Leukocytes,Ua NEGATIVE NEGATIVE    Comment: Performed at [facility], [address], [place], [place] [address]   MR BRAIN WO CONTRAST  Result Date: 11/12/2019 CLINICAL DATA:  Ataxia, vision change and numbness. Right facial droop. Clinical diagnosis of Bell's palsy. EXAM: MRI HEAD WITHOUT CONTRAST TECHNIQUE: Multiplanar, multiecho pulse sequences of the brain and surrounding  structures were obtained without intravenous contrast. COMPARISON:  None. FINDINGS: Brain: No acute infarction, hemorrhage, hydrocephalus, extra-axial collection or mass lesion. Focus of increased T2 signal is seen within the posterior aspect of the pons extending to the right middle cerebellar peduncle. This lesion involves the right facial colliculus. Other small T2 hyperintense foci are seen within the white matter of the cerebral hemispheres, including deep, juxtacortical and periventricular white matter. Vascular: Normal flow voids. Skull and upper cervical spine: Normal marrow signal. Sinuses/Orbits: Minimal mucosal thickening of the ethmoid cells. The orbits are maintained. IMPRESSION: Nonspecific T2 hyperintense white matter lesions are present within the deep, juxta cortical, and periventricular white matter as well as in the posterior aspect of the pons involving the right facial colliculus. Findings are suggestive of demyelinating disease. Differential diagnosis includes inflammatory/post infectious processes and vasculitis. These results were called by telephone at the time of interpretation on 11/12/2019 at 11:33 am to provider [facility] , who verbally acknowledged these results. Electronically Signed   By: Baldemar Lenis M.D.   On: 11/12/2019 11:35   MR BRAIN W CONTRAST  Result Date: 11/12/2019 CLINICAL DATA:  34 year old male with ataxia, visual changes, right facial droop, numbness. Noncontrast MRI earlier today suspicious for demyelinating disease, including abnormal signal in the dorsal brainstem. EXAM: MRI HEAD WITH CONTRAST TECHNIQUE: Multiplanar, multiecho pulse sequences of the brain and surrounding structures were obtained with intravenous contrast. CONTRAST:  8mL GADAVIST GADOBUTROL 1 MMOL/ML IV SOLN COMPARISON:  Noncontrast brain MRI earlier today. FINDINGS: Stable cerebral morphology from earlier with no intracranial mass effect or ventriculomegaly. There are  several small and indistinct foci of cerebral white matter enhancement identified. A 4 mm area of enhancement at the right corona radiata is seen on series 12, image 33. And two similar areas of enhancement are noted in the left periatrial white matter on series 14, images 17 and 18. Additionally, there is patchy solid versus discontinuous enhancement in the right dorsal brainstem lesion (series 14, image 12). No superimposed dural thickening. Orbit and cervical spine details are reported separately. IMPRESSION: 1. Several small and indistinct foci of enhancement corresponding to some of the cerebral white matter lesions and the right dorsal brainstem lesion demonstrated earlier today. This constellation is most suggestive of acute on chronic demyelinating disease. 2. Orbit and cervical MRI reported separately. Electronically Signed   By: Odessa Fleming M.D.   On: 11/12/2019 18:38  MR Cervical Spine W or Wo Contrast  Result Date: 11/12/2019 CLINICAL DATA:  34 year old male with ataxia, visual changes, right facial droop, numbness. Noncontrast MRI earlier today suspicious for demyelinating disease, including abnormal signal in the dorsal brainstem. EXAM: MRI CERVICAL SPINE WITHOUT AND WITH CONTRAST TECHNIQUE: Multiplanar and multiecho pulse sequences of the cervical spine, to include the craniocervical junction and cervicothoracic junction, were obtained without and with intravenous contrast. CONTRAST:  51mL GADAVIST GADOBUTROL 1 MMOL/ML IV SOLN in conjunction with contrast enhanced imaging of the brain and orbits reported separately. COMPARISON:  Brain MRI earlier today. FINDINGS: Alignment: Preserved cervical lordosis. Vertebrae: No marrow edema or evidence of acute osseous abnormality. Visualized bone marrow signal is within normal limits. Cord: Normal cervical and upper thoracic spinal cord signal and morphology. No cord demyelinating disease identified. No abnormal intradural enhancement. No dural thickening.  Posterior Fossa, vertebral arteries, paraspinal tissues: Cervicomedullary junction is within normal limits. Dorsal right brainstem lesion with enhancement again noted, reported separately. Preserved major vascular flow voids in the neck. Codominant appearing vertebral arteries. Negative visible neck soft tissues. Negative right lung apex. Disc levels: No significant degenerative changes, multilevel mild left side cervical endplate spurring. IMPRESSION: Normal for age MRI of the cervical spine. No evidence of cervical spinal cord demyelination. Electronically Signed   By: Genevie Ann M.D.   On: 11/12/2019 18:45   MR ORBITS W WO CONTRAST  Result Date: 11/12/2019 CLINICAL DATA:  34 year old male with ataxia, visual changes, right facial droop, numbness. Noncontrast MRI earlier today suspicious for demyelinating disease, including abnormal signal in the dorsal brainstem. EXAM: MRI OF THE ORBITS WITHOUT AND WITH CONTRAST TECHNIQUE: Multiplanar, multisequence MR imaging of the orbits was performed both before and after the administration of intravenous contrast. CONTRAST:  20mL GADAVIST GADOBUTROL 1 MMOL/ML IV SOLN in conjunction with contrast enhanced imaging of the brain and cervical spine reported separately. COMPARISON:  Brain MRI today. FINDINGS: Normal suprasellar cistern. Pituitary is within normal limits. No cavernous sinus abnormality identified. Normal optic chiasm. Pre chiasmatic optic nerves appear symmetric and within normal limits. No definite optic nerve enhancement. There is mildly Disconjugate gaze, but otherwise the globes and other intraorbital soft tissues appears symmetric and within normal limits. No intraorbital mass or inflammation. Superficial periorbital soft tissues are within normal limits. Visible major vascular flow voids at the skull base are preserved. Visible internal auditory structures appear normal. No abnormal IAC enhancement. Visualized bone marrow signal is within normal limits.  Mildly nodular enhancement at the dorsal right brainstem lesion again noted on series 10, image 4. IMPRESSION: 1. MRI appearance of both orbits within normal limits. 2. Abnormal enhancement of the brain parenchyma, see head MRI with contrast reported separately. Electronically Signed   By: Genevie Ann M.D.   On: 11/12/2019 18:42    Pending Labs Unresulted Labs (From admission, onward)    Start     Ordered   11/13/19 7858  Basic metabolic panel  Tomorrow morning,   R     11/12/19 2022   11/13/19 0500  CBC  Tomorrow morning,   R     11/12/19 2022   11/12/19 2057  Angiotensin converting enzyme  Once,   STAT     11/12/19 2056   11/12/19 2056  ANA w/Reflex if Positive  Once,   STAT     11/12/19 2056   11/12/19 2056  Sjogrens syndrome-A extractable nuclear antibody  (Sjogren's Syndrome Antibods (SSA + SSB) (PNL))  Once,   STAT  11/12/19 2056   11/12/19 2056  Sjogrens syndrome-B extractable nuclear antibody  (Sjogren's Syndrome Antibods (SSA + SSB) (PNL))  Once,   STAT     11/12/19 2056   11/12/19 2021  HIV Antibody (routine testing w rflx)  (HIV Antibody (Routine testing w reflex) panel)  Once,   STAT     11/12/19 2022   11/12/19 1848  SARS CORONAVIRUS 2 (TAT 6-24 HRS) Nasopharyngeal Nasopharyngeal Swab  (Tier 3 (TAT 6-24 hrs))  ONCE - STAT,   STAT    Question Answer Comment  Is this test for diagnosis or screening Screening   Symptomatic for COVID-19 as defined by CDC No   Hospitalized for COVID-19 No   Admitted to ICU for COVID-19 No   Previously tested for COVID-19 No   Resident in a congregate (group) care setting No   Employed in healthcare setting No      11/12/19 1847          Vitals/Pain Today's Vitals   11/12/19 0857 11/12/19 0858 11/12/19 1921 11/12/19 2149  BP: (!) 156/80  124/70 115/77  Pulse: 90   86  Resp: 15   16  Temp: 98 F (36.7 C)     TempSrc: Oral     SpO2: 100%   94%  PainSc:  0-No pain      Isolation Precautions No active  isolations  Medications Medications  methylPREDNISolone sodium succinate (SOLU-MEDROL) 1,000 mg in sodium chloride 0.9 % 50 mL IVPB (has no administration in time range)  gadobutrol (GADAVIST) 1 MMOL/ML injection 10 mL (10 mLs Intravenous Contrast Given 11/12/19 1824)  methylPREDNISolone sodium succinate (SOLU-MEDROL) 1,000 mg in sodium chloride 0.9 % 50 mL IVPB (0 mg Intravenous Stopped 11/12/19 2142)    Mobility walks

## 2019-11-13 ENCOUNTER — Encounter (HOSPITAL_COMMUNITY): Payer: Self-pay | Admitting: Family Medicine

## 2019-11-13 DIAGNOSIS — G35 Multiple sclerosis: Principal | ICD-10-CM

## 2019-11-13 LAB — RAPID URINE DRUG SCREEN, HOSP PERFORMED
Amphetamines: NOT DETECTED
Barbiturates: NOT DETECTED
Benzodiazepines: NOT DETECTED
Cocaine: NOT DETECTED
Opiates: NOT DETECTED
Tetrahydrocannabinol: NOT DETECTED

## 2019-11-13 LAB — CBC
HCT: 50.2 % (ref 39.0–52.0)
Hemoglobin: 16.8 g/dL (ref 13.0–17.0)
MCH: 29.5 pg (ref 26.0–34.0)
MCHC: 33.5 g/dL (ref 30.0–36.0)
MCV: 88.2 fL (ref 80.0–100.0)
Platelets: 175 10*3/uL (ref 150–400)
RBC: 5.69 MIL/uL (ref 4.22–5.81)
RDW: 13.7 % (ref 11.5–15.5)
WBC: 10.7 10*3/uL — ABNORMAL HIGH (ref 4.0–10.5)
nRBC: 0 % (ref 0.0–0.2)

## 2019-11-13 LAB — BASIC METABOLIC PANEL
Anion gap: 9 (ref 5–15)
BUN: 17 mg/dL (ref 6–20)
CO2: 23 mmol/L (ref 22–32)
Calcium: 9.4 mg/dL (ref 8.9–10.3)
Chloride: 105 mmol/L (ref 98–111)
Creatinine, Ser: 0.85 mg/dL (ref 0.61–1.24)
GFR calc Af Amer: >60 mL/min (ref >60)
GFR calc non Af Amer: >60 mL/min (ref >60)
Glucose, Bld: 168 mg/dL — ABNORMAL HIGH (ref 70–99)
Potassium: 4.3 mmol/L (ref 3.5–5.1)
Sodium: 137 mmol/L (ref 135–145)

## 2019-11-13 LAB — HEMOGLOBIN A1C
Hgb A1c MFr Bld: 5.5 % (ref 4.8–5.6)
Mean Plasma Glucose: 111.15 mg/dL

## 2019-11-13 LAB — HIV ANTIBODY (ROUTINE TESTING W REFLEX): HIV Screen 4th Generation wRfx: NONREACTIVE

## 2019-11-13 LAB — SARS CORONAVIRUS 2 (TAT 6-24 HRS): SARS Coronavirus 2: NEGATIVE

## 2019-11-13 MED ORDER — NICOTINE 14 MG/24HR TD PT24
14.0000 mg | MEDICATED_PATCH | Freq: Every day | TRANSDERMAL | Status: DC
  Administered 2019-11-13 – 2019-11-16 (×4): 14 mg via TRANSDERMAL
  Filled 2019-11-13 (×4): qty 1

## 2019-11-13 NOTE — Progress Notes (Signed)
OT Cancellation Note  Patient Details Name: ESAI STECKLEIN MRN: [id number] DOB: 1986-03-16   Cancelled Treatment:    Reason Eval/Treat Not Completed: Other (comment);OT screened, no needs identified, will sign off.   Patient is independent with mobility, has been ambulating to bathroom independently with PT reporting patient ambulate on stairs without difficulty and no loss of balance observed. Patient's main concerns is vision at this time, no acute OT needs.  Note L nystagmus when tracking left and R eye inability to cross midline when attempting to track to the right. Would recommend patient follow up with neuro ophthalmologist / outpatient vision therapy.   Myrtie Neither OT OT office: [phone]   Carmelia Roller 11/13/2019, 11:36 AM

## 2019-11-13 NOTE — Progress Notes (Signed)
PROGRESS NOTE    Joseph Thomas  IZT:[id number] DOB: Aug 08, 1986 DOA: 11/12/2019 PCP: Hal Morales, NP   Brief Narrative: 34 y.o. male with tobacco use and no other known medical problems who presents with concerns of worsening vision changes and ataxia.  About 2 weeks ago, he was just sitting in his truck when he suddenly developed right-sided facial numbness and drooping.  He then called EMS and was told to follow-up with his PCP.  He saw his PCP and was diagnosed with Bell's palsy and was started on prednisone and amoxicillin.  His facial numbness/drooping resolved with the prednisone but he also noticed progressively worsening vision changes.  His visions have been blurry, cloudy and some diplopia.  He also noticed balance issues when he was asked to do heel-to-toe at his PCPs office.  Has not had any falls.  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  He endorsed tobacco use of 1 pack/day for the past 7 to 8 years.  Denies any alcohol or illicit drug use.  Patient works as a [employer].  Denies any chemical exposures. Family history "mother, brother and sister otherwise healthy.  Dad has diabetes.  Denies any history of neurological disorders.  ED Course: He was afebrile, normotensive on room air.  CBC shows leukocytosis of 12.3, hemoglobin of 17.5.  Glucose of 106 and all other electrolytes normal.  MRI brain without contrast shows nonspecific T2 hyperintense white matter lesions. MRI brain with contrast showed several small and indistinct foci of enhancement.  MRI of both orbits within normal limits.  Normal cervical spine MRI.  ED physician discussed with neurology and he will be evaluated by them at bedside tonight and will start high dose steroids x 3 days.   Assessment & Plan:   Principal Problem:   Multiple sclerosis (HCC) Active Problems:   Ataxia   Hyperglycemia   Leukocytosis   Tobacco user   #1 ataxia with  vision changes question secondary to multiple sclerosis-patient admitted with ataxia and changes with his vision. MRI of the brain-several small indistinct foci of enhancement corresponding to some of the cerebral white matter lesions and the right dorsal brainstem lesion most suggestive of acute on chronic demyelinating disease. MRI orbits normal MRI C-spine no active or acute changes Check urine drug screen Seen by neuro and appreciated. Patient started on steroids Solu-Medrol 1 g daily for 3 to 5 days. He walked with PT today with improvement in his vision. Continue IV steroids  Follow-up pending labs  #2 tobacco abuse advised cessation  #3 steroid-induced hyperglycemia hemoglobin A1c is 5.5 continue SSI CBG (last 3)  Recent Labs    11/12/19 0908  GLUCAP 99      Estimated body mass index is 34.8 kg/m as calculated from the following:   Height as of this encounter: 6\' 1"  (1.854 m).   Weight as of this encounter: 119.7 kg.  DVT prophylaxis: SCD Code Status: Full code Family Communication: Discussed with his mother Disposition Plan: Patient came from home Plan is to discharge him home Barriers to discharge on IV Solu-Medrol for demyelinating brain lesions consistent with multiple sclerosis   Consultants: Neurology  Procedures: None Antimicrobials: None  Subjective: Patient is resting in bed he walked with therapy today his gait has improved and VISION has improved  Objective: Vitals:   11/12/19 2226 11/13/19 0009 11/13/19 0307 11/13/19 0532  BP: 115/72 116/75 118/70 114/66  Pulse: 61 (!) 52 (!) 53 (!)  49  Resp: 16 18 16 18   Temp: 98.1 F (36.7 C) (!) 97.5 F (36.4 C) 98.2 F (36.8 C) (!) 97.3 F (36.3 C)  TempSrc: Oral Oral Oral   SpO2: 96% 96% 98% 98%  Weight: 119.7 kg     Height: 6\' 1"  (1.854 m)       Intake/Output Summary (Last 24 hours) at 11/13/2019 1219 Last data filed at 11/12/2019 2142 Gross per 24 hour  Intake 49.44 ml  Output --  Net 49.44 ml    Filed Weights   11/12/19 2226  Weight: 119.7 kg    Examination:  General exam: Appears calm and comfortable  Respiratory system: Clear to auscultation. Respiratory effort normal. Cardiovascular system: S1 & S2 heard, RRR. No JVD, murmurs, rubs, gallops or clicks. No pedal edema. Gastrointestinal system: Abdomen is nondistended, soft and nontender. No organomegaly or masses felt. Normal bowel sounds heard. Central nervous system: Alert and oriented. No focal neurological deficits.  Impaired right eye abduction Extremities: Symmetric 5 x 5 power. Skin: No rashes, lesions or ulcers Psychiatry: Judgement and insight appear normal. Mood & affect appropriate.     Data Reviewed: I have personally reviewed following labs and imaging studies  CBC: Recent Labs  Lab 11/12/19 0900 11/13/19 0610  WBC 12.3* 10.7*  HGB 17.5* 16.8  HCT 53.8* 50.2  MCV 90.1 88.2  PLT 166 175   Basic Metabolic Panel: Recent Labs  Lab 11/12/19 0900 11/13/19 0610  NA 138 137  K 4.0 4.3  CL 105 105  CO2 23 23  GLUCOSE 106* 168*  BUN 20 17  CREATININE 0.97 0.85  CALCIUM 9.0 9.4   GFR: Estimated Creatinine Clearance: 167.5 mL/min (by C-G formula based on SCr of 0.85 mg/dL). Liver Function Tests: No results for input(s): AST, ALT, ALKPHOS, BILITOT, PROT, ALBUMIN in the last 168 hours. No results for input(s): LIPASE, AMYLASE in the last 168 hours. No results for input(s): AMMONIA in the last 168 hours. Coagulation Profile: No results for input(s): INR, PROTIME in the last 168 hours. Cardiac Enzymes: No results for input(s): CKTOTAL, CKMB, CKMBINDEX, TROPONINI in the last 168 hours. BNP (last 3 results) No results for input(s): PROBNP in the last 8760 hours. HbA1C: Recent Labs    11/13/19 0610  HGBA1C 5.5   CBG: Recent Labs  Lab 11/12/19 0908  GLUCAP 99   Lipid Profile: No results for input(s): CHOL, HDL, LDLCALC, TRIG, CHOLHDL, LDLDIRECT in the last 72 hours. Thyroid Function  Tests: No results for input(s): TSH, T4TOTAL, FREET4, T3FREE, THYROIDAB in the last 72 hours. Anemia Panel: No results for input(s): VITAMINB12, FOLATE, FERRITIN, TIBC, IRON, RETICCTPCT in the last 72 hours. Sepsis Labs: No results for input(s): PROCALCITON, LATICACIDVEN in the last 168 hours.  Recent Results (from the past 240 hour(s))  SARS CORONAVIRUS 2 (TAT 6-24 HRS) Nasopharyngeal Nasopharyngeal Swab     Status: None   Collection Time: 11/12/19  8:53 PM   Specimen: Nasopharyngeal Swab  Result Value Ref Range Status   SARS Coronavirus 2 NEGATIVE NEGATIVE Final    Comment: (NOTE) SARS-CoV-2 target nucleic acids are NOT DETECTED. The SARS-CoV-2 RNA is generally detectable in upper and lower respiratory specimens during the acute phase of infection. Negative results do not preclude SARS-CoV-2 infection, do not rule out co-infections with other pathogens, and should not be used as the sole basis for treatment or other patient management decisions. Negative results must be combined with clinical observations, patient history, and epidemiological information. The expected result is Negative. Fact  Sheet for Patients: HairSlick.no Fact Sheet for Healthcare Providers: quierodirigir.com This test is not yet approved or cleared by the [place] FDA and  has been authorized for detection and/or diagnosis of SARS-CoV-2 by FDA under an Emergency Use Authorization (EUA). This EUA will remain  in effect (meaning this test can be used) for the duration of the COVID-19 declaration under Section 56 4(b)(1) of the Act, 21 U.S.C. section 360bbb-3(b)(1), unless the authorization is terminated or revoked sooner. Performed at [facility], [address] [place], [place] [address]          Radiology Studies: MR BRAIN WO CONTRAST  Result Date: 11/12/2019 CLINICAL DATA:  Ataxia, vision change and numbness. Right facial droop.  Clinical diagnosis of Bell's palsy. EXAM: MRI HEAD WITHOUT CONTRAST TECHNIQUE: Multiplanar, multiecho pulse sequences of the brain and surrounding structures were obtained without intravenous contrast. COMPARISON:  None. FINDINGS: Brain: No acute infarction, hemorrhage, hydrocephalus, extra-axial collection or mass lesion. Focus of increased T2 signal is seen within the posterior aspect of the pons extending to the right middle cerebellar peduncle. This lesion involves the right facial colliculus. Other small T2 hyperintense foci are seen within the white matter of the cerebral hemispheres, including deep, juxtacortical and periventricular white matter. Vascular: Normal flow voids. Skull and upper cervical spine: Normal marrow signal. Sinuses/Orbits: Minimal mucosal thickening of the ethmoid cells. The orbits are maintained. IMPRESSION: Nonspecific T2 hyperintense white matter lesions are present within the deep, juxta cortical, and periventricular white matter as well as in the posterior aspect of the pons involving the right facial colliculus. Findings are suggestive of demyelinating disease. Differential diagnosis includes inflammatory/post infectious processes and vasculitis. These results were called by telephone at the time of interpretation on 11/12/2019 at 11:33 am to provider [facility] , who verbally acknowledged these results. Electronically Signed   By: Baldemar Lenis M.D.   On: 11/12/2019 11:35   MR BRAIN W CONTRAST  Result Date: 11/12/2019 CLINICAL DATA:  34 year old male with ataxia, visual changes, right facial droop, numbness. Noncontrast MRI earlier today suspicious for demyelinating disease, including abnormal signal in the dorsal brainstem. EXAM: MRI HEAD WITH CONTRAST TECHNIQUE: Multiplanar, multiecho pulse sequences of the brain and surrounding structures were obtained with intravenous contrast. CONTRAST:  27mL GADAVIST GADOBUTROL 1 MMOL/ML IV SOLN COMPARISON:   Noncontrast brain MRI earlier today. FINDINGS: Stable cerebral morphology from earlier with no intracranial mass effect or ventriculomegaly. There are several small and indistinct foci of cerebral white matter enhancement identified. A 4 mm area of enhancement at the right corona radiata is seen on series 12, image 33. And two similar areas of enhancement are noted in the left periatrial white matter on series 14, images 17 and 18. Additionally, there is patchy solid versus discontinuous enhancement in the right dorsal brainstem lesion (series 14, image 12). No superimposed dural thickening. Orbit and cervical spine details are reported separately. IMPRESSION: 1. Several small and indistinct foci of enhancement corresponding to some of the cerebral white matter lesions and the right dorsal brainstem lesion demonstrated earlier today. This constellation is most suggestive of acute on chronic demyelinating disease. 2. Orbit and cervical MRI reported separately. Electronically Signed   By: Odessa Fleming M.D.   On: 11/12/2019 18:38   MR Cervical Spine W or Wo Contrast  Result Date: 11/12/2019 CLINICAL DATA:  34 year old male with ataxia, visual changes, right facial droop, numbness. Noncontrast MRI earlier today suspicious for demyelinating disease, including abnormal signal in the dorsal brainstem.  EXAM: MRI CERVICAL SPINE WITHOUT AND WITH CONTRAST TECHNIQUE: Multiplanar and multiecho pulse sequences of the cervical spine, to include the craniocervical junction and cervicothoracic junction, were obtained without and with intravenous contrast. CONTRAST:  43mL GADAVIST GADOBUTROL 1 MMOL/ML IV SOLN in conjunction with contrast enhanced imaging of the brain and orbits reported separately. COMPARISON:  Brain MRI earlier today. FINDINGS: Alignment: Preserved cervical lordosis. Vertebrae: No marrow edema or evidence of acute osseous abnormality. Visualized bone marrow signal is within normal limits. Cord: Normal cervical and  upper thoracic spinal cord signal and morphology. No cord demyelinating disease identified. No abnormal intradural enhancement. No dural thickening. Posterior Fossa, vertebral arteries, paraspinal tissues: Cervicomedullary junction is within normal limits. Dorsal right brainstem lesion with enhancement again noted, reported separately. Preserved major vascular flow voids in the neck. Codominant appearing vertebral arteries. Negative visible neck soft tissues. Negative right lung apex. Disc levels: No significant degenerative changes, multilevel mild left side cervical endplate spurring. IMPRESSION: Normal for age MRI of the cervical spine. No evidence of cervical spinal cord demyelination. Electronically Signed   By: Genevie Ann M.D.   On: 11/12/2019 18:45   MR ORBITS W WO CONTRAST  Result Date: 11/12/2019 CLINICAL DATA:  34 year old male with ataxia, visual changes, right facial droop, numbness. Noncontrast MRI earlier today suspicious for demyelinating disease, including abnormal signal in the dorsal brainstem. EXAM: MRI OF THE ORBITS WITHOUT AND WITH CONTRAST TECHNIQUE: Multiplanar, multisequence MR imaging of the orbits was performed both before and after the administration of intravenous contrast. CONTRAST:  64mL GADAVIST GADOBUTROL 1 MMOL/ML IV SOLN in conjunction with contrast enhanced imaging of the brain and cervical spine reported separately. COMPARISON:  Brain MRI today. FINDINGS: Normal suprasellar cistern. Pituitary is within normal limits. No cavernous sinus abnormality identified. Normal optic chiasm. Pre chiasmatic optic nerves appear symmetric and within normal limits. No definite optic nerve enhancement. There is mildly Disconjugate gaze, but otherwise the globes and other intraorbital soft tissues appears symmetric and within normal limits. No intraorbital mass or inflammation. Superficial periorbital soft tissues are within normal limits. Visible major vascular flow voids at the skull base are  preserved. Visible internal auditory structures appear normal. No abnormal IAC enhancement. Visualized bone marrow signal is within normal limits. Mildly nodular enhancement at the dorsal right brainstem lesion again noted on series 10, image 4. IMPRESSION: 1. MRI appearance of both orbits within normal limits. 2. Abnormal enhancement of the brain parenchyma, see head MRI with contrast reported separately. Electronically Signed   By: Genevie Ann M.D.   On: 11/12/2019 18:42        Scheduled Meds: Continuous Infusions: . methylPREDNISolone (SOLU-MEDROL) injection 1,000 mg (11/13/19 1019)     LOS: 1 day     Georgette Shell, MD  11/13/2019, 12:19 PM

## 2019-11-13 NOTE — Progress Notes (Signed)
Neurology Progress Note   S:// Seen and examined.  No significant changes.   O:// Current vital signs: BP 114/66   Pulse 77   Temp 98.4 F (36.9 C)   Resp 16   Ht 6\' 1"  (1.854 m)   Wt 119.7 kg   SpO2 96%   BMI 34.80 kg/m  Vital signs in last 24 hours: Temp:  [97.3 F (36.3 C)-98.4 F (36.9 C)] 98.4 F (36.9 C) (03/26 1449) Pulse Rate:  [49-86] 77 (03/26 1449) Resp:  [16-18] 16 (03/26 1449) BP: (114-124)/(66-77) 114/66 (03/26 0532) SpO2:  [94 %-98 %] 96 % (03/26 1449) Weight:  [119.7 kg] 119.7 kg (03/25 2226) Neurological exam Awake alert oriented x3 Speech not dysarthric No evidence of aphasia Cranial nerve examination: Pupils are equal round reactive light, right eye has minimal movement in either direction, left eye does not adduct and has florid nystagmus on abduction, mild right facial weakness as well as decreased sensation on the right. Motor exam: 5/5 in all fours Sensation: Intact to light touch all over Cerebellar: No ataxia  Medications  Current Facility-Administered Medications:  .  methylPREDNISolone sodium succinate (SOLU-MEDROL) 1,000 mg in sodium chloride 0.9 % 50 mL IVPB, 1,000 mg, Intravenous, Daily, Tu, Ching T, DO, Last Rate: 58 mL/hr at 11/13/19 1019, 1,000 mg at 11/13/19 1019 .  nicotine (NICODERM CQ - dosed in mg/24 hours) patch 14 mg, 14 mg, Transdermal, Daily, 11/15/19, MD, 14 mg at 11/13/19 1604 Labs CBC    Component Value Date/Time   WBC 10.7 (H) 11/13/2019 0610   RBC 5.69 11/13/2019 0610   HGB 16.8 11/13/2019 0610   HCT 50.2 11/13/2019 0610   PLT 175 11/13/2019 0610   MCV 88.2 11/13/2019 0610   MCH 29.5 11/13/2019 0610   MCHC 33.5 11/13/2019 0610   RDW 13.7 11/13/2019 0610    CMP     Component Value Date/Time   NA 137 11/13/2019 0610   K 4.3 11/13/2019 0610   CL 105 11/13/2019 0610   CO2 23 11/13/2019 0610   GLUCOSE 168 (H) 11/13/2019 0610   BUN 17 11/13/2019 0610   CREATININE 0.85 11/13/2019 0610   CALCIUM 9.4  11/13/2019 0610   GFRNONAA >60 11/13/2019 0610   GFRAA >60 11/13/2019 0610   Imaging I have reviewed images in epic and the results pertinent to this consultation are: MRI brain shows multiple T2 lesions in the periventricular area-specifically at the floor of the fourth ventricle, subcortical white matter and brainstem.  Multiple lesions of enhancement including that on the floor of the fourth ventricle which involves probably the facial colliculus, the 6th nerve as well as the left and is possibly responsible for the 1-1/2 syndrome  Assessment: New onset multiple sclerosis with the eye symptoms consistent with almost a 1-1/2 syndrome-likely due to the lesion in the floor of the fourth ventricle.  Also had "Bell's palsy" which I also think is from the same lesion as it is in the area of the 6th and 7th nerve nuclei.  Recommendations: Continue 5 days of IV steroids Continue PT OT Check ANA SSA SSB and angiotensin-converting enzyme-as has been recommended yesterday by Dr. 11/15/2019 Outpatient follow[facility] with [facility] neurology- [facility] -- Epimenio Foot, MD [facility] Pager: [phone] If 7pm to 7am, please call on call as listed on AMION.

## 2019-11-13 NOTE — Evaluation (Signed)
Physical Therapy Evaluation Patient Details Name: Joseph Thomas MRN: [id number] DOB: 1986-06-29 Today's Date: 11/13/2019   History of Present Illness  Joseph Thomas is a 34 y.o. male with tobacco use and no other known medical problems who presents with concerns of worsening vision changes and ataxia. About 2 weeks ago, he was just sitting in his truck when he suddenly developed right-sided facial numbness and drooping.  He then called EMS and was told to follow-up with his PCP.  He saw his PCP and was diagnosed with Bell's palsy and was started on prednisone and amoxicillin.  His facial numbness/drooping resolved with the prednisone but he also noticed progressively worsening vision changes.  His visions have been blurry, cloudy and some diplopia.  He also noticed balance issues when he was asked to do heel-to-toe at his PCPs office.  Has not had any falls.  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    Clinical Impression  Physical therapy evaluation completed, patient is at baseline and no further PT services recommended at this time. Pt able to ambulate throughout hallway, complete turns, navigates 10 steps reciprocally without handrail and no loss of balance. Pt with some improvement in R eye movement, but continues to be significantly limited and unable to move R eye laterally past midline. Pt would benefit from OPPT upon discharge for high level balance activity and eye movement. Patient discharged to care of nursing for ambulation daily as tolerated for length of stay.      Follow Up Recommendations Outpatient PT    Equipment Recommendations  None recommended by PT    Recommendations for Other Services       Precautions / Restrictions Precautions Precautions: None Restrictions Weight Bearing Restrictions: No      Mobility  Bed Mobility Overal bed mobility: Independent         Transfers Overall transfer level:  Independent     Ambulation/Gait Ambulation/Gait assistance: Independent Gait Distance (Feet): 200 Feet Assistive device: None Gait Pattern/deviations: WFL(Within Functional Limits);Step-through pattern Gait velocity: WNL   General Gait Details: ambulates in hallway, completing turns, no loss of balance or staggering noted  Stairs Stairs: Yes Stairs assistance: Independent Stair Management: No rails Number of Stairs: 10 General stair comments: ascend/descend with reciprocal pattern, no handrail  Wheelchair Mobility    Modified Rankin (Stroke Patients Only)       Balance Overall balance assessment: Independent                        Pertinent Vitals/Pain Pain Assessment: No/denies pain    Home Living Family/patient expects to be discharged to:: Private residence Living Arrangements: Parent Available Help at Discharge: Family;Available 24 hours/day Type of Home: House Home Access: Stairs to enter Entrance Stairs-Rails: Right;Left;Can reach both Entrance Stairs-Number of Steps: 3-4 Home Layout: One level Home Equipment: None      Prior Function Level of Independence: Independent         Comments: Pt independent with ADLs, full time truck driver requiring driving, tarping and strapping, plays golf, and basketball.     Hand Dominance        Extremity/Trunk Assessment   Upper Extremity Assessment Upper Extremity Assessment: Defer to OT evaluation    Lower Extremity Assessment Lower Extremity Assessment: Overall WFL for tasks assessed(BLE grossly 5/5, light touch in tact, no clonus noted)    Cervical / Trunk Assessment Cervical / Trunk Assessment: Normal  Communication  Communication: No difficulties  Cognition Arousal/Alertness: Awake/alert Behavior During Therapy: WFL for tasks assessed/performed Overall Cognitive Status: Within Functional Limits for tasks assessed               General Comments      Exercises      Assessment/Plan    PT Assessment All further PT needs can be met in the next venue of care  PT Problem List Decreased balance       PT Treatment Interventions      PT Goals (Current goals can be found in the Care Plan section)  Acute Rehab PT Goals Patient Stated Goal: go home, get back to playing golf and basketball PT Goal Formulation: With patient Time For Goal Achievement: 11/13/19 Potential to Achieve Goals: Good    Frequency     Barriers to discharge        Co-evaluation               AM-PAC PT "6 Clicks" Mobility  Outcome Measure Help needed turning from your back to your side while in a flat bed without using bedrails?: None Help needed moving from lying on your back to sitting on the side of a flat bed without using bedrails?: None Help needed moving to and from a bed to a chair (including a wheelchair)?: None Help needed standing up from a chair using your arms (e.g., wheelchair or bedside chair)?: None Help needed to walk in hospital room?: None Help needed climbing 3-5 steps with a railing? : None 6 Click Score: 24    End of Session Equipment Utilized During Treatment: Gait belt Activity Tolerance: Patient tolerated treatment well Patient left: in chair;with call bell/phone within reach Nurse Communication: Mobility status PT Visit Diagnosis: Other symptoms and signs involving the nervous system (R29.898)    Time: 0240-9735 PT Time Calculation (min) (ACUTE ONLY): 13 min   Charges:   PT Evaluation $PT Eval Low Complexity: 1 Low PT Treatments $Gait Training: 8-22 mins        Tori Demetres Prochnow PT, DPT 11/13/19, 9:07 AM [phone]

## 2019-11-14 ENCOUNTER — Inpatient Hospital Stay (HOSPITAL_COMMUNITY): Payer: Self-pay

## 2019-11-14 DIAGNOSIS — R9431 Abnormal electrocardiogram [ECG] [EKG]: Secondary | ICD-10-CM

## 2019-11-14 LAB — ANGIOTENSIN CONVERTING ENZYME: Angiotensin-Converting Enzyme: 21 U/L (ref 14–82)

## 2019-11-14 LAB — SJOGRENS SYNDROME-B EXTRACTABLE NUCLEAR ANTIBODY: SSB (La) (ENA) Antibody, IgG: <0.2 AI (ref 0.0–0.9)

## 2019-11-14 LAB — ECHOCARDIOGRAM COMPLETE
Height: 73 in
Weight: 4220.8 oz

## 2019-11-14 LAB — ANA W/REFLEX IF POSITIVE: Anti Nuclear Antibody (ANA): NEGATIVE

## 2019-11-14 LAB — SJOGRENS SYNDROME-A EXTRACTABLE NUCLEAR ANTIBODY: SSA (Ro) (ENA) Antibody, IgG: <0.2 AI (ref 0.0–0.9)

## 2019-11-14 NOTE — Progress Notes (Signed)
PROGRESS NOTE    Joseph Thomas  ZHG:[id number] DOB: 1986-06-24 DOA: 11/12/2019 PCP: Hal Morales, NP  Brief Narrative: 34 y.o.malewithtobacco use and no other known medical problems who presents with concerns of worsening vision changes and ataxia.  About 2 weeks ago, he was just sitting in his truck when he suddenly developed right-sided facial numbness and drooping. He then called EMS and was told to follow-up with his PCP. He saw his PCP and was diagnosed with Bell's palsy and was started on prednisone and amoxicillin. His facial numbness/drooping resolved with the prednisone but he also noticed progressively worsening vision changes. His visions have been blurry, cloudy and some diplopia. He also noticed balance issues when he was asked to do heel-to-toe at his PCPs office. Has not had any falls. 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  He endorsed tobacco use of 1 pack/day for the past 7 to 8 years. Denies any alcohol or illicit drug use. Patient works as a [employer]. Denies any chemical exposures. Family history "mother, brother and sister otherwise healthy. Dad has diabetes. Denies any history of neurological disorders.  ED Course:He was afebrile, normotensive on room air. CBC shows leukocytosis of 12.3, hemoglobin of 17.5. Glucose of 106 and all other electrolytes normal.  MRI brain without contrast shows nonspecific T2 hyperintense white matter lesions. MRI brain with contrast showed several small and indistinct foci of enhancement.  MRI of both orbits within normal limits. Normal cervical spine MRI.Marland Kitchen   Assessment & Plan:   Principal Problem:   Multiple sclerosis (HCC) Active Problems:   Ataxia   Hyperglycemia   Leukocytosis   Tobacco user  #1 ataxia with vision changes question secondary to multiple sclerosis-patient admitted with ataxia and changes with his vision. MRI of the brain-several  small indistinct foci of enhancement corresponding to some of the cerebral white matter lesions and the right dorsal brainstem lesion most suggestive of acute on chronic demyelinating disease. MRI orbits normal MRI C-spine no active or acute changes Check urine drug screen Seen by neuro and appreciated. Patient started on steroids Solu-Medrol 1 g daily for  5 days.  He is on his third dose today. He reports no improvement in his eye symptoms. ANA SSA SSB ace have been sent and pending. Neurology following and seems to think this is 1-1/2 syndrome and MS.  #2 tobacco abuse advised cessation  #3 steroid-induced hyperglycemia hemoglobin A1c is 5.5 continue SSI   #4 bradycardia asymptomatic check EKG and echo  Estimated body mass index is 34.8 kg/m as calculated from the following:   Height as of this encounter: 6\' 1"  (1.854 m).   Weight as of this encounter: 119.7 kg.  DVT prophylaxis: SCD Code Status: Full code Family Communication: Discussed with his mother Disposition Plan: Patient came from home Plan is to discharge him home Barriers to discharge on IV Solu-Medrol for demyelinating brain lesions consistent with multiple sclerosis   Consultants: Neurology  Procedures: None Antimicrobials: None  Subjective:  He is resting in bed reports no big improvement denies any dysphagia or dysarthria. Objective: Vitals:   11/13/19 0532 11/13/19 1449 11/13/19 2057 11/14/19 0549  BP: 114/66  129/71 111/69  Pulse: (!) 49 77 61 74  Resp: 18 16 17 17   Temp: (!) 97.3 F (36.3 C) 98.4 F (36.9 C) 97.9 F (36.6 C) 97.7 F (36.5 C)  TempSrc:   Oral Oral  SpO2: 98% 96% 92% 96%  Weight:  Height:        Intake/Output Summary (Last 24 hours) at 11/14/2019 0946 Last data filed at 11/14/2019 0859 Gross per 24 hour  Intake 650 ml  Output --  Net 650 ml   Filed Weights   11/12/19 2226  Weight: 119.7 kg    Examination:  General exam: Appears calm and comfortable   Respiratory system: Clear to auscultation. Respiratory effort normal. Cardiovascular system: S1 & S2 heard, RRR. No JVD, murmurs, rubs, gallops or clicks. No pedal edema. Gastrointestinal system: Abdomen is nondistended, soft and nontender. No organomegaly or masses felt. Normal bowel sounds heard. Central nervous system: Alert and oriented.  Right eye abduction impaired with decreased sensation to the right side of the face Extremities: Symmetric 5 x 5 power. Skin: No rashes, lesions or ulcers Psychiatry: Judgement and insight appear normal. Mood & affect appropriate.     Data Reviewed: I have personally reviewed following labs and imaging studies  CBC: Recent Labs  Lab 11/12/19 0900 11/13/19 0610  WBC 12.3* 10.7*  HGB 17.5* 16.8  HCT 53.8* 50.2  MCV 90.1 88.2  PLT 166 175   Basic Metabolic Panel: Recent Labs  Lab 11/12/19 0900 11/13/19 0610  NA 138 137  K 4.0 4.3  CL 105 105  CO2 23 23  GLUCOSE 106* 168*  BUN 20 17  CREATININE 0.97 0.85  CALCIUM 9.0 9.4   GFR: Estimated Creatinine Clearance: 167.5 mL/min (by C-G formula based on SCr of 0.85 mg/dL). Liver Function Tests: No results for input(s): AST, ALT, ALKPHOS, BILITOT, PROT, ALBUMIN in the last 168 hours. No results for input(s): LIPASE, AMYLASE in the last 168 hours. No results for input(s): AMMONIA in the last 168 hours. Coagulation Profile: No results for input(s): INR, PROTIME in the last 168 hours. Cardiac Enzymes: No results for input(s): CKTOTAL, CKMB, CKMBINDEX, TROPONINI in the last 168 hours. BNP (last 3 results) No results for input(s): PROBNP in the last 8760 hours. HbA1C: Recent Labs    11/13/19 0610  HGBA1C 5.5   CBG: Recent Labs  Lab 11/12/19 0908  GLUCAP 99   Lipid Profile: No results for input(s): CHOL, HDL, LDLCALC, TRIG, CHOLHDL, LDLDIRECT in the last 72 hours. Thyroid Function Tests: No results for input(s): TSH, T4TOTAL, FREET4, T3FREE, THYROIDAB in the last 72 hours. Anemia  Panel: No results for input(s): VITAMINB12, FOLATE, FERRITIN, TIBC, IRON, RETICCTPCT in the last 72 hours. Sepsis Labs: No results for input(s): PROCALCITON, LATICACIDVEN in the last 168 hours.  Recent Results (from the past 240 hour(s))  SARS CORONAVIRUS 2 (TAT 6-24 HRS) Nasopharyngeal Nasopharyngeal Swab     Status: None   Collection Time: 11/12/19  8:53 PM   Specimen: Nasopharyngeal Swab  Result Value Ref Range Status   SARS Coronavirus 2 NEGATIVE NEGATIVE Final    Comment: (NOTE) SARS-CoV-2 target nucleic acids are NOT DETECTED. The SARS-CoV-2 RNA is generally detectable in upper and lower respiratory specimens during the acute phase of infection. Negative results do not preclude SARS-CoV-2 infection, do not rule out co-infections with other pathogens, and should not be used as the sole basis for treatment or other patient management decisions. Negative results must be combined with clinical observations, patient history, and epidemiological information. The expected result is Negative. Fact Sheet for Patients: HairSlick.no Fact Sheet for Healthcare Providers: quierodirigir.com This test is not yet approved or cleared by the [place] FDA and  has been authorized for detection and/or diagnosis of SARS-CoV-2 by FDA under an Emergency Use Authorization (EUA). This EUA  will remain  in effect (meaning this test can be used) for the duration of the COVID-19 declaration under Section 56 4(b)(1) of the Act, 21 U.S.C. section 360bbb-3(b)(1), unless the authorization is terminated or revoked sooner. Performed at [facility], [place] [address] [place], [place] [address]          Radiology Studies: MR BRAIN WO CONTRAST  Result Date: 11/12/2019 CLINICAL DATA:  Ataxia, vision change and numbness. Right facial droop. Clinical diagnosis of Bell's palsy. EXAM: MRI HEAD WITHOUT CONTRAST TECHNIQUE: Multiplanar, multiecho  pulse sequences of the brain and surrounding structures were obtained without intravenous contrast. COMPARISON:  None. FINDINGS: Brain: No acute infarction, hemorrhage, hydrocephalus, extra-axial collection or mass lesion. Focus of increased T2 signal is seen within the posterior aspect of the pons extending to the right middle cerebellar peduncle. This lesion involves the right facial colliculus. Other small T2 hyperintense foci are seen within the white matter of the cerebral hemispheres, including deep, juxtacortical and periventricular white matter. Vascular: Normal flow voids. Skull and upper cervical spine: Normal marrow signal. Sinuses/Orbits: Minimal mucosal thickening of the ethmoid cells. The orbits are maintained. IMPRESSION: Nonspecific T2 hyperintense white matter lesions are present within the deep, juxta cortical, and periventricular white matter as well as in the posterior aspect of the pons involving the right facial colliculus. Findings are suggestive of demyelinating disease. Differential diagnosis includes inflammatory/post infectious processes and vasculitis. These results were called by telephone at the time of interpretation on 11/12/2019 at 11:33 am to provider [facility] , who verbally acknowledged these results. Electronically Signed   By: Pedro Earls M.D.   On: 11/12/2019 11:35   MR BRAIN W CONTRAST  Result Date: 11/12/2019 CLINICAL DATA:  34 year old male with ataxia, visual changes, right facial droop, numbness. Noncontrast MRI earlier today suspicious for demyelinating disease, including abnormal signal in the dorsal brainstem. EXAM: MRI HEAD WITH CONTRAST TECHNIQUE: Multiplanar, multiecho pulse sequences of the brain and surrounding structures were obtained with intravenous contrast. CONTRAST:  19mL GADAVIST GADOBUTROL 1 MMOL/ML IV SOLN COMPARISON:  Noncontrast brain MRI earlier today. FINDINGS: Stable cerebral morphology from earlier with no intracranial mass  effect or ventriculomegaly. There are several small and indistinct foci of cerebral white matter enhancement identified. A 4 mm area of enhancement at the right corona radiata is seen on series 12, image 33. And two similar areas of enhancement are noted in the left periatrial white matter on series 14, images 17 and 18. Additionally, there is patchy solid versus discontinuous enhancement in the right dorsal brainstem lesion (series 14, image 12). No superimposed dural thickening. Orbit and cervical spine details are reported separately. IMPRESSION: 1. Several small and indistinct foci of enhancement corresponding to some of the cerebral white matter lesions and the right dorsal brainstem lesion demonstrated earlier today. This constellation is most suggestive of acute on chronic demyelinating disease. 2. Orbit and cervical MRI reported separately. Electronically Signed   By: Genevie Ann M.D.   On: 11/12/2019 18:38   MR Cervical Spine W or Wo Contrast  Result Date: 11/12/2019 CLINICAL DATA:  34 year old male with ataxia, visual changes, right facial droop, numbness. Noncontrast MRI earlier today suspicious for demyelinating disease, including abnormal signal in the dorsal brainstem. EXAM: MRI CERVICAL SPINE WITHOUT AND WITH CONTRAST TECHNIQUE: Multiplanar and multiecho pulse sequences of the cervical spine, to include the craniocervical junction and cervicothoracic junction, were obtained without and with intravenous contrast. CONTRAST:  7mL GADAVIST GADOBUTROL 1 MMOL/ML IV SOLN in conjunction  with contrast enhanced imaging of the brain and orbits reported separately. COMPARISON:  Brain MRI earlier today. FINDINGS: Alignment: Preserved cervical lordosis. Vertebrae: No marrow edema or evidence of acute osseous abnormality. Visualized bone marrow signal is within normal limits. Cord: Normal cervical and upper thoracic spinal cord signal and morphology. No cord demyelinating disease identified. No abnormal intradural  enhancement. No dural thickening. Posterior Fossa, vertebral arteries, paraspinal tissues: Cervicomedullary junction is within normal limits. Dorsal right brainstem lesion with enhancement again noted, reported separately. Preserved major vascular flow voids in the neck. Codominant appearing vertebral arteries. Negative visible neck soft tissues. Negative right lung apex. Disc levels: No significant degenerative changes, multilevel mild left side cervical endplate spurring. IMPRESSION: Normal for age MRI of the cervical spine. No evidence of cervical spinal cord demyelination. Electronically Signed   By: Odessa Fleming M.D.   On: 11/12/2019 18:45   MR ORBITS W WO CONTRAST  Result Date: 11/12/2019 CLINICAL DATA:  34 year old male with ataxia, visual changes, right facial droop, numbness. Noncontrast MRI earlier today suspicious for demyelinating disease, including abnormal signal in the dorsal brainstem. EXAM: MRI OF THE ORBITS WITHOUT AND WITH CONTRAST TECHNIQUE: Multiplanar, multisequence MR imaging of the orbits was performed both before and after the administration of intravenous contrast. CONTRAST:  7mL GADAVIST GADOBUTROL 1 MMOL/ML IV SOLN in conjunction with contrast enhanced imaging of the brain and cervical spine reported separately. COMPARISON:  Brain MRI today. FINDINGS: Normal suprasellar cistern. Pituitary is within normal limits. No cavernous sinus abnormality identified. Normal optic chiasm. Pre chiasmatic optic nerves appear symmetric and within normal limits. No definite optic nerve enhancement. There is mildly Disconjugate gaze, but otherwise the globes and other intraorbital soft tissues appears symmetric and within normal limits. No intraorbital mass or inflammation. Superficial periorbital soft tissues are within normal limits. Visible major vascular flow voids at the skull base are preserved. Visible internal auditory structures appear normal. No abnormal IAC enhancement. Visualized bone marrow  signal is within normal limits. Mildly nodular enhancement at the dorsal right brainstem lesion again noted on series 10, image 4. IMPRESSION: 1. MRI appearance of both orbits within normal limits. 2. Abnormal enhancement of the brain parenchyma, see head MRI with contrast reported separately. Electronically Signed   By: Odessa Fleming M.D.   On: 11/12/2019 18:42        Scheduled Meds: . nicotine  14 mg Transdermal Daily   Continuous Infusions: . methylPREDNISolone (SOLU-MEDROL) injection 1,000 mg (11/13/19 1019)     LOS: 2 days   Alwyn Ren, MD 11/14/2019, 9:46 AM

## 2019-11-15 MED ORDER — SODIUM CHLORIDE 0.9 % IV SOLN
1000.0000 mg | Freq: Every day | INTRAVENOUS | Status: AC
  Administered 2019-11-15 – 2019-11-16 (×2): 1000 mg via INTRAVENOUS
  Filled 2019-11-15 (×2): qty 8

## 2019-11-15 NOTE — Progress Notes (Signed)
PROGRESS NOTE    Joseph Thomas  WUX:[id number] DOB: Jul 07, 1986 DOA: 11/12/2019 PCP: Hal Morales, NP   Brief Narrative:34 y.o.malewithtobacco use and no other known medical problems who presents with concerns of worsening vision changes and ataxia.  About 2 weeks ago, he was just sitting in his truck when he suddenly developed right-sided facial numbness and drooping. He then called EMS and was told to follow-up with his PCP. He saw his PCP and was diagnosed with Bell's palsy and was started on prednisone and amoxicillin. His facial numbness/drooping resolved with the prednisone but he also noticed progressively worsening vision changes. His visions have been blurry, cloudy and some diplopia. He also noticed balance issues when he was asked to do heel-to-toe at his PCPs office. Has not had any falls. 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  He endorsed tobacco use of 1 pack/day for the past 7 to 8 years. Denies any alcohol or illicit drug use. Patient works as a [employer]. Denies any chemical exposures. Family history "mother, brother and sister otherwise healthy. Dad has diabetes. Denies any history of neurological disorders.  ED Course:He was afebrile, normotensive on room air. CBC shows leukocytosis of 12.3, hemoglobin of 17.5. Glucose of 106 and all other electrolytes normal.  MRI brain without contrast shows nonspecific T2 hyperintense white matter lesions. MRI brain with contrast showed several small and indistinct foci of enhancement.  MRI of both orbits within normal limits. Normal cervical spine MRI.Marland Kitchen  Assessment & Plan:   Principal Problem:   Multiple sclerosis (HCC) Active Problems:   Ataxia   Hyperglycemia   Leukocytosis   Tobacco user   #1 ataxia with vision changes question secondary to multiple sclerosis-patient admitted with ataxia and changes with his vision.  MRI of the brain-several  small indistinct foci of enhancement corresponding to some of the cerebral white matter lesions and the right dorsal brainstem lesion most suggestive of acute on chronic demyelinating disease. MRI orbits normal MRI C-spine no active or acute changes  urine drug screen negative Seen by neuro and appreciated. Patient started on steroids Solu-Medrol 1 g daily for  5 days.  He is on his4 dose today. He reports no improvement in his eye symptoms. ANA SSA SSB ace have been sent and pending. Neurology following and seems to think this is 1-1/2 syndrome and MS. F/u dr sader as out patient   #2 tobacco abuse advised cessation  #3 steroid-induced hyperglycemia hemoglobin A1c is 5.5 continue SSI   #4 bradycardia asymptomatic -echo Left ventricular ejection fraction, by estimation, is 60 to 65%. The  left ventricle has normal function. The left ventricle has no regional  wall motion abnormalities. Left ventricular diastolic parameters were normal. Right ventricular systolic function is normal. The right ventricular  size is normal.   Right atrial size was mildly dilated.   The mitral valve is normal in structure. No evidence of mitral valve  regurgitation. No evidence of mitral stenosis.  The aortic valve is normal in structure. Aortic valve regurgitation is  not visualized. No aortic stenosis is present.     Estimated body mass index is 34.8 kg/m as calculated from the following:   Height as of this encounter: 6\' 1"  (1.854 m).   Weight as of this encounter: 119.7 kg.  DVT prophylaxis:SCD Code Status:Full code Family Communication:Discussed with his mother Disposition Plan:Patient came from home Plan is to discharge him home Barriers to discharge on IV Solu-Medrol for  demyelinating brain lesions consistent with multiple sclerosis   Consultants:Neurology  Procedures:None Antimicrobials:None  Subjective:  He feels about the same.. Denies nausea vomiting  Eyes still  continues to bother him  Objective: Vitals:   11/14/19 0549 11/14/19 1401 11/14/19 2114 11/15/19 0454  BP: 111/69 123/64 128/61 118/60  Pulse: 74 (!) 54 61 (!) 57  Resp: 17 12 16 16   Temp: 97.7 F (36.5 C) 97.8 F (36.6 C) 98.2 F (36.8 C) 98.2 F (36.8 C)  TempSrc: Oral  Oral Oral  SpO2: 96% 98% 96% 94%  Weight:      Height:        Intake/Output Summary (Last 24 hours) at 11/15/2019 1105 Last data filed at 11/15/2019 0454 Gross per 24 hour  Intake 360 ml  Output --  Net 360 ml   Filed Weights   11/12/19 2226  Weight: 119.7 kg    Examination:  General exam: Appears calm and comfortable  Respiratory system: Clear to auscultation. Respiratory effort normal. Cardiovascular system: S1 & S2 heard, RRR. No JVD, murmurs, rubs, gallops or clicks. No pedal edema. Gastrointestinal system: Abdomen is nondistended, soft and nontender. No organomegaly or masses felt. Normal bowel sounds heard. Central nervous system: Alert and oriented.left eye nystagmus right eye abduction impaired   Extremities: Symmetric 5 x 5 power. Skin: No rashes, lesions or ulcers Psychiatry: Judgement and insight appear normal. Mood & affect appropriate.     Data Reviewed: I have personally reviewed following labs and imaging studies  CBC: Recent Labs  Lab 11/12/19 0900 11/13/19 0610  WBC 12.3* 10.7*  HGB 17.5* 16.8  HCT 53.8* 50.2  MCV 90.1 88.2  PLT 166 175   Basic Metabolic Panel: Recent Labs  Lab 11/12/19 0900 11/13/19 0610  NA 138 137  K 4.0 4.3  CL 105 105  CO2 23 23  GLUCOSE 106* 168*  BUN 20 17  CREATININE 0.97 0.85  CALCIUM 9.0 9.4   GFR: Estimated Creatinine Clearance: 167.5 mL/min (by C-G formula based on SCr of 0.85 mg/dL). Liver Function Tests: No results for input(s): AST, ALT, ALKPHOS, BILITOT, PROT, ALBUMIN in the last 168 hours. No results for input(s): LIPASE, AMYLASE in the last 168 hours. No results for input(s): AMMONIA in the last 168 hours. Coagulation  Profile: No results for input(s): INR, PROTIME in the last 168 hours. Cardiac Enzymes: No results for input(s): CKTOTAL, CKMB, CKMBINDEX, TROPONINI in the last 168 hours. BNP (last 3 results) No results for input(s): PROBNP in the last 8760 hours. HbA1C: Recent Labs    11/13/19 0610  HGBA1C 5.5   CBG: Recent Labs  Lab 11/12/19 0908  GLUCAP 99   Lipid Profile: No results for input(s): CHOL, HDL, LDLCALC, TRIG, CHOLHDL, LDLDIRECT in the last 72 hours. Thyroid Function Tests: No results for input(s): TSH, T4TOTAL, FREET4, T3FREE, THYROIDAB in the last 72 hours. Anemia Panel: No results for input(s): VITAMINB12, FOLATE, FERRITIN, TIBC, IRON, RETICCTPCT in the last 72 hours. Sepsis Labs: No results for input(s): PROCALCITON, LATICACIDVEN in the last 168 hours.  Recent Results (from the past 240 hour(s))  SARS CORONAVIRUS 2 (TAT 6-24 HRS) Nasopharyngeal Nasopharyngeal Swab     Status: None   Collection Time: 11/12/19  8:53 PM   Specimen: Nasopharyngeal Swab  Result Value Ref Range Status   SARS Coronavirus 2 NEGATIVE NEGATIVE Final    Comment: (NOTE) SARS-CoV-2 target nucleic acids are NOT DETECTED. The SARS-CoV-2 RNA is generally detectable in upper and lower respiratory specimens during the acute phase of infection.  Negative results do not preclude SARS-CoV-2 infection, do not rule out co-infections with other pathogens, and should not be used as the sole basis for treatment or other patient management decisions. Negative results must be combined with clinical observations, patient history, and epidemiological information. The expected result is Negative. Fact Sheet for Patients: SugarRoll.be Fact Sheet for Healthcare Providers: https://www.woods-Jamayia Croker.com/ This test is not yet approved or cleared by the [place] FDA and  has been authorized for detection and/or diagnosis of SARS-CoV-2 by FDA under an Emergency Use  Authorization (EUA). This EUA will remain  in effect (meaning this test can be used) for the duration of the COVID-19 declaration under Section 56 4(b)(1) of the Act, 21 U.S.C. section 360bbb-3(b)(1), unless the authorization is terminated or revoked sooner. Performed at [facility], [place] [address], [place], [place] [address]          Radiology Studies: ECHOCARDIOGRAM COMPLETE  Result Date: 11/14/2019    ECHOCARDIOGRAM REPORT   Patient Name:   RUGER SAXER Date of Exam: 11/14/2019 Medical Rec #:  [id number]      Height:       73.0 in Accession #:    [id number]     Weight:       263.8 lb Date of Birth:  1985-11-18      BSA:          2.420 m Patient Age:    52 years       BP:           111/69 mmHg Patient Gender: M              HR:           74 bpm. Exam Location:  Inpatient Procedure: 2D Echo Indications:    794.31 Abnormal ECG  History:        Patient has no prior history of Echocardiogram examinations.                 Risk Factors:Current Smoker.  Sonographer:    Jannett Celestine RDCS (AE) Referring Phys: [id number] Noland Fordyce Zarya Lasseigne IMPRESSIONS  1. Left ventricular ejection fraction, by estimation, is 60 to 65%. The left ventricle has normal function. The left ventricle has no regional wall motion abnormalities. Left ventricular diastolic parameters were normal.  2. Right ventricular systolic function is normal. The right ventricular size is normal.  3. Right atrial size was mildly dilated.  4. The mitral valve is normal in structure. No evidence of mitral valve regurgitation. No evidence of mitral stenosis.  5. The aortic valve is normal in structure. Aortic valve regurgitation is not visualized. No aortic stenosis is present. FINDINGS  Left Ventricle: Left ventricular ejection fraction, by estimation, is 60 to 65%. The left ventricle has normal function. The left ventricle has no regional wall motion abnormalities. The left ventricular internal cavity size was normal in size. There is  no  left ventricular hypertrophy. Left ventricular diastolic parameters were normal. Right Ventricle: The right ventricular size is normal. No increase in right ventricular wall thickness. Right ventricular systolic function is normal. Left Atrium: Left atrial size was normal in size. Right Atrium: Right atrial size was mildly dilated. Pericardium: There is no evidence of pericardial effusion. Mitral Valve: The mitral valve is normal in structure. No evidence of mitral valve regurgitation. No evidence of mitral valve stenosis. Tricuspid Valve: The tricuspid valve is normal in structure. Tricuspid valve regurgitation is not demonstrated. Aortic Valve: The aortic valve is normal in structure.  Aortic valve regurgitation is not visualized. No aortic stenosis is present. Pulmonic Valve: The pulmonic valve was grossly normal. Pulmonic valve regurgitation is not visualized. Aorta: The aortic root and ascending aorta are structurally normal, with no evidence of dilitation. IAS/Shunts: The atrial septum is grossly normal.  LEFT VENTRICLE PLAX 2D LVIDd:         5.40 cm  Diastology LVIDs:         3.00 cm  LV e' medial:   16.10 cm/s LV PW:         1.10 cm  LV E/e' medial: 8.1 LV IVS:        1.00 cm LVOT diam:     2.00 cm LV SV:         99 LV SV Index:   41 LVOT Area:     3.14 cm  RIGHT VENTRICLE RV S prime:     14.80 cm/s TAPSE (M-mode): 3.7 cm LEFT ATRIUM           Index       RIGHT ATRIUM           Index LA diam:      4.10 cm 1.69 cm/m  RA Area:     19.70 cm LA Vol (A4C): 29.6 ml 12.23 ml/m RA Volume:   49.30 ml  20.37 ml/m  AORTIC VALVE LVOT Vmax:   163.00 cm/s LVOT Vmean:  92.600 cm/s LVOT VTI:    0.315 m  AORTA Ao Root diam: 2.90 cm MITRAL VALVE MV Area (PHT): 3.91 cm     SHUNTS MV Decel Time: 194 msec     Systemic VTI:  0.32 m MV E velocity: 131.00 cm/s  Systemic Diam: 2.00 cm Kristeen Miss MD Electronically signed by Kristeen Miss MD Signature Date/Time: 11/14/2019/4:00:43 PM    Final         Scheduled Meds: .  nicotine  14 mg Transdermal Daily   Continuous Infusions: . methylPREDNISolone (SOLU-MEDROL) injection       LOS: 3 days     Alwyn Ren, MD  11/15/2019, 11:05 AM

## 2019-11-15 NOTE — Plan of Care (Signed)
Continue 5 days of IV Solu-Medrol 1 g. He should be okay to be discharged after that with follow-up with [facility] neurology, Dr. Despina Arias in the coming 2 to 4 weeks. PT OT speech therapy  -- Milon Dikes, MD [facility] Pager: [phone] If 7pm to 7am, please call on call as listed on AMION.

## 2019-11-15 NOTE — TOC Initial Note (Signed)
Transition of Care [facility]) - Initial/Assessment Note    Patient Details  Name: Joseph Thomas MRN: [id number] Date of Birth: [birth year]  [facility] CM/SW Contact:    Servando Snare, LCSW Phone Number: 11/15/2019, 2:29 PM  Clinical Narrative:     Patient has new MS dx. Patient and family requested SW contact. LCSW spoke with patients mother, Neoma Laming, who inquired about info regarding financial resources. LCSW email financial counseling for patient follow up. LCSW explained financial counseling  process to patients mother. 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 LCSW provided info on [facility] and orange card program. LCSW explained Match program and that it may be an option depending on the cost of meds at dc. LCSW and mother discussed healthcare.gov and calling the 1-800 number on patient bill if he needs assistance. Patients mother expressed gratitude with information provided and follow up.               Expected Discharge Plan: Home/Self Care Barriers to Discharge: Continued Medical Work up   Patient Goals and CMS Choice Patient states their goals for this hospitalization and ongoing recovery are:: Return home   Choice offered to / list presented to : NA  Expected Discharge Plan and Services Expected Discharge Plan: Home/Self Care     Post Acute Care Choice: NA Living arrangements for the past 2 months: Single Family Home                                      Prior Living Arrangements/Services Living arrangements for the past 2 months: Single Family Home Lives with:: Self Patient language and need for interpreter reviewed:: No Do you feel safe going back to the place where you live?: Yes      Need for Family Participation in Patient Care: Yes (Comment) Care giver support system in place?: Yes (comment)   Criminal Activity/Legal Involvement Pertinent to Current  Situation/Hospitalization: No - Comment as needed  Activities of Daily Living Home Assistive Devices/Equipment: Eyeglasses ADL Screening (condition at time of admission) Patient's cognitive ability adequate to safely complete daily activities?: Yes Is the patient deaf or have difficulty hearing?: No Does the patient have difficulty seeing, even when wearing glasses/contacts?: Yes(cloudy vision, blurry, some double vision) Does the patient have difficulty concentrating, remembering, or making decisions?: No Patient able to express need for assistance with ADLs?: Yes Does the patient have difficulty dressing or bathing?: No Independently performs ADLs?: Yes (appropriate for developmental age) Does the patient have difficulty walking or climbing stairs?: Yes(if balance is ok) Weakness of Legs: None Weakness of Arms/Hands: None  Permission Sought/Granted Permission sought to share information with : Family Supports Permission granted to share information with : Yes, Verbal Permission Granted  Share Information with NAME: Neoma Laming and Assad     Permission granted to share info w Relationship: Parents     Emotional Assessment Appearance:: Appears stated age Attitude/Demeanor/Rapport: Engaged Affect (typically observed): Pleasant Orientation: : Oriented to Self, Oriented to Place, Oriented to  Time, Oriented to Situation Alcohol / Substance Use: Tobacco Use Psych Involvement: No (comment)  Admission diagnosis:  Ataxia [R27.0] Visual disturbance [H53.9] Right facial numbness [R20.0] Patient Active Problem List   Diagnosis Date Noted  . Ataxia 11/12/2019  . Multiple sclerosis ([place]) 11/12/2019  . Hyperglycemia 11/12/2019  . Leukocytosis 11/12/2019  .  Tobacco user 11/12/2019   PCP:  Hal Morales, NP Pharmacy:   [facility] - [place], [place] - [address] [place] [address] Phone: [phone] Fax: [phone]     Social Determinants of Health  (SDOH) Interventions    Readmission Risk Interventions Readmission Risk Prevention Plan 11/15/2019  Transportation Screening Complete  Some recent data might be hidden

## 2019-11-16 ENCOUNTER — Encounter (HOSPITAL_COMMUNITY): Payer: Self-pay | Admitting: Family Medicine

## 2019-11-16 LAB — EXTRACTABLE NUCLEAR ANTIGEN ANTIBODY
ENA SM Ab Ser-aCnc: <0.2 AI (ref 0.0–0.9)
Ribonucleic Protein: 0.3 AI (ref 0.0–0.9)
SSA (Ro) (ENA) Antibody, IgG: <0.2 AI (ref 0.0–0.9)
SSB (La) (ENA) Antibody, IgG: <0.2 AI (ref 0.0–0.9)
Scleroderma (Scl-70) (ENA) Antibody, IgG: <0.2 AI (ref 0.0–0.9)
ds DNA Ab: <1 IU/mL (ref 0–9)

## 2019-11-16 LAB — ANGIOTENSIN CONVERTING ENZYME: Angiotensin-Converting Enzyme: 22 U/L (ref 14–82)

## 2019-11-16 LAB — ANTI-JO 1 ANTIBODY, IGG: Anti JO-1: <0.2 AI (ref 0.0–0.9)

## 2019-11-16 MED ORDER — NICOTINE 14 MG/24HR TD PT24: 14.0000 mg | MEDICATED_PATCH | Freq: Every day | TRANSDERMAL | 0 refills | Status: DC

## 2019-11-16 NOTE — Progress Notes (Signed)
Received a call from central telemetry about the patient's HR dropping below 40 and sustaining. Patient is asymptomatic and nurse has already woken patient once for this for an assessment. Pt states he feels fine, he looks good and BP is 126/71 at 0221. Paged Anna Genre, NP to make aware.

## 2019-11-16 NOTE — Discharge Instructions (Addendum)
Visual Disturbances  A visual disturbance is any problem that interferes with your normal vision. This can affect one eye or both eyes. Some types of visual disturbances come and go without treatment and do not cause a permanent problem. Other visual disturbances may be a sign of a medical emergency. Visual disturbances include:  Blurred vision.  Being unable to see certain colors.  Being sensitive to light.  Double vision.  Partial vision loss (visual field deficit).  Being unaware of objects on one side of the body (visual spatial inattention).  Rhythmic eye movements that you cannot control (nystagmus).  Short-term or long-term blindness.  Seeing: ? Floating spots or lines (floaters). ? Flashing or shimmering lights. ? Zigzagging lines or stars. ? The floor as tilted (visual midline shift). ? Things that are not really there (hallucinations). Causes of visual disturbances include:  Eye infection.  The thin membrane at the back of the eye separating from the eyeball (retinal detachment).  High blood pressure.  Migraine.  Glaucoma.  Ischemic stroke.  Cerebral aneurysm. It is important to get your eyes checked by a health care provider or eye specialist (ophthalmologist or optometrist) as soon as possible to determine the cause of your visual disturbance. Follow these instructions at home:  Take over-the-counter and prescription medicines only as told by your health care provider.  Do not use any products that contain nicotine or tobacco, such as cigarettes and e-cigarettes. If you need help quitting, ask your health care provider.  To lower your risk of the problems that can lead to visual disturbances: ? Eat a balanced diet that includes fruits and vegetables, whole grains, lean meat, and low-fat dairy. ? Maintain a healthy weight. Work with your health care provider to lose weight if you need to. ? Exercise regularly. Ask your health care provider what  activities are safe for you.  Do not drive if you have trouble seeing. Ask your health care provider for guidance about when it is and is not safe for you to drive.  Keep all follow-up visits as told by your health care provider. This is important. Contact a health care provider if:  Your visual disturbance changes or becomes worse. Get help right away if you:   Have new visual disturbances.  Suddenly see flashing lights or floaters.  Suddenly have a dark area in your field of vision, especially in the lower part. This can lead to a loss of central vision.  Lose vision in one or both eyes.  Have any symptoms of a stroke. "BE FAST" is an easy way to remember the main warning signs of a stroke: ? B - Balance. Signs are dizziness, sudden trouble walking, or loss of balance. ? E - Eyes. Signs are trouble seeing or a sudden change in vision. ? F - Face. Signs are sudden weakness or numbness of the face, or the face or eyelid drooping on one side. ? A - Arms. Signs are weakness or numbness in an arm. This happens suddenly and usually on one side of the body. ? S - Speech. Signs are sudden trouble speaking, slurred speech, or trouble understanding what people say. ? T - Time. Time to call emergency services. Write down what time symptoms started.  Have other signs of a stroke, such as: ? A sudden, severe headache with no known cause. ? Nausea or vomiting. ? Seizure. These symptoms may represent a serious problem that is an emergency. Do not wait to see if the symptoms will go   away. Get medical help right away. Call your local emergency services (911 in the [place] Do not drive yourself to the hospital. Summary  A visual disturbance is any problem that interferes with your normal vision.  Some visual disturbances may be a sign of a medical emergency.  It is important to get your eyes checked by a health care provider or eye specialist to determine what kind of visual disturbance you  have. This information is not intended to replace advice given to you by your health care provider. Make sure you discuss any questions you have with your health care provider. Document Revised: 11/25/2018 Document Reviewed: 08/27/2017 Elsevier Patient Education  2020 [employer].  

## 2019-11-16 NOTE — Discharge Summary (Signed)
Physician Discharge Summary  Joseph Thomas DGL:[id number] DOB: 25-Aug-1985 DOA: 11/12/2019  PCP: Charlynn Court, NP  Admit date: 11/12/2019 Discharge date: 11/16/2019  Admitted From: Home Disposition: Home  Recommendations for Outpatient Follow-up:  1. Follow up with PCP in 1-2 weeks 2. Please obtain BMP/CBC in one week 3. Please follow up with Dr. Felecia Shelling neurologist please call for appointment  Home Health: None Equipment/Devices: None Discharge Condition: Stable and improved CODE STATUS: Full code  diet recommendation: Cardiac  Brief/Interim Summary:34 y.o.malewithtobacco use and no other known medical problems who presents with concerns of worsening vision changes and ataxia.  About 2 weeks ago, he was just sitting in his truck when he suddenly developed right-sided facial numbness and drooping. He then called EMS and was told to follow-up with his PCP. He saw his PCP and was diagnosed with Bell's palsy and was started on prednisone and amoxicillin. His facial numbness/drooping resolved with the prednisone but he also noticed progressively worsening vision changes. His visions have been blurry, cloudy and some diplopia. He also noticed balance issues when he was asked to do heel-to-toe at his PCPs office. Has not had any falls. 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  He endorsed tobacco use of 1 pack/day for the past 7 to 8 years. Denies any alcohol or illicit drug use. Patient works as a Administrator. Denies any chemical exposures. Family history "mother, brother and sister otherwise healthy. Dad has diabetes. Denies any history of neurological disorders.  ED Course:He was afebrile, normotensive on room air. CBC shows leukocytosis of 12.3, hemoglobin of 17.5. Glucose of 106 and all other electrolytes normal.  MRI brain without contrast shows nonspecific T2 hyperintense white matter lesions. MRI brain with  contrast showed several small and indistinct foci of enhancement.  MRI of both orbits within normal limits. Normal cervical spine MRI.Marland Kitchen   Discharge Diagnoses:  Principal Problem:   Multiple sclerosis ([place]) Active Problems:   Ataxia   Hyperglycemia   Leukocytosis   Tobacco user   #1 ataxia with vision changes likely secondary to multiple sclerosis and wanted to have syndrome-patient admitted with ataxia and changes with his vision.  MRI of the brain-several small indistinct foci of enhancement corresponding to some of the cerebral white matter lesions and the right dorsal brainstem lesion most suggestive of acute on chronic demyelinating disease. MRI orbits normal MRI C-spine no active or acute changes  urine drug screen negative He was seen by neurology and recommended to treat him with Solu-Medrol 1 g daily for 5 days. He received all 5 doses of Solu-Medrol. He reports no improvement in his eye symptoms. ANA SSA SSBacehave been sent and pending. F/u dr sader as out patient  He is a Administrator by profession.  I have advised him not to drive.  #2 tobacco abuse advised cessation  #3 steroid-induced hyperglycemia hemoglobin A1c is 5.5 he was treated with sliding scale insulin during the hospital stay.  #4 bradycardia asymptomatic -echo Left ventricular ejection fraction, by estimation, is 60 to 65%. The  left ventricle has normal function. The left ventricle has no regional wall motion abnormalities. Left ventricular diastolic parameters were normal. Right ventricular systolic function is normal. The right ventricular  size is normal.   Right atrial size was mildly dilated.   The mitral valve is normal in structure. No evidence of mitral valve  regurgitation. No evidence of mitral stenosis.  The aortic valve is normal in structure. Aortic  valve regurgitation is  not visualized. No aortic stenosis is present.    Estimated body mass index is 34.8 kg/m as calculated  from the following:   Height as of this encounter:  (1.854 m).   Weight as of this encounter: 119.7 kg.  Discharge Instructions  Discharge Instructions    Call MD for:  difficulty breathing, headache or visual disturbances   Complete by: As directed    Call MD for:  persistant dizziness or light-headedness   Complete by: As directed    Call MD for:  persistant nausea and vomiting   Complete by: As directed    Call MD for:  severe uncontrolled pain   Complete by: As directed    Call MD for:  temperature >100.4   Complete by: As directed    Diet - low sodium heart healthy   Complete by: As directed    Increase activity slowly   Complete by: As directed      Allergies as of 11/16/2019   No Known Allergies     Medication List    STOP taking these medications   amoxicillin-clavulanate 875-125 MG tablet Commonly known as: AUGMENTIN     TAKE these medications   nicotine 14 mg/24hr patch Commonly known as: NICODERM CQ - dosed in mg/24 hours Place 1 patch (14 mg total) onto the skin daily.      Follow-up Information    Hal Morales, NP Follow up.   Specialty: Nurse Practitioner Contact information: [address] [place] [address] [phone]        Asa Lente, MD Follow up.   Specialty: Neurology Contact information: [address] [place] [address] [phone]          No Known Allergies  Consultations: Neurology  Procedures/Studies: MR BRAIN WO CONTRAST  Result Date: 11/12/2019 CLINICAL DATA:  Ataxia, vision change and numbness. Right facial droop. Clinical diagnosis of Bell's palsy. EXAM: MRI HEAD WITHOUT CONTRAST TECHNIQUE: Multiplanar, multiecho pulse sequences of the brain and surrounding structures were obtained without intravenous contrast. COMPARISON:  None. FINDINGS: Brain: No acute infarction, hemorrhage, hydrocephalus, extra-axial collection or mass lesion. Focus of increased T2 signal is seen within the posterior aspect of  the pons extending to the right middle cerebellar peduncle. This lesion involves the right facial colliculus. Other small T2 hyperintense foci are seen within the white matter of the cerebral hemispheres, including deep, juxtacortical and periventricular white matter. Vascular: Normal flow voids. Skull and upper cervical spine: Normal marrow signal. Sinuses/Orbits: Minimal mucosal thickening of the ethmoid cells. The orbits are maintained. IMPRESSION: Nonspecific T2 hyperintense white matter lesions are present within the deep, juxta cortical, and periventricular white matter as well as in the posterior aspect of the pons involving the right facial colliculus. Findings are suggestive of demyelinating disease. Differential diagnosis includes inflammatory/post infectious processes and vasculitis. These results were called by telephone at the time of interpretation on 11/12/2019 at 11:33 am to provider [facility] , who verbally acknowledged these results. Electronically Signed   By: Baldemar Lenis M.D.   On: 11/12/2019 11:35   MR BRAIN W CONTRAST  Result Date: 11/12/2019 CLINICAL DATA:  35 year old male with ataxia, visual changes, right facial droop, numbness. Noncontrast MRI earlier today suspicious for demyelinating disease, including abnormal signal in the dorsal brainstem. EXAM: MRI HEAD WITH CONTRAST TECHNIQUE: Multiplanar, multiecho pulse sequences of the brain and surrounding structures were obtained with intravenous contrast. CONTRAST:  10mL GADAVIST GADOBUTROL 1 MMOL/ML IV SOLN COMPARISON:  Noncontrast brain MRI earlier today. FINDINGS: Stable cerebral morphology from earlier with no intracranial mass effect or ventriculomegaly. There are several small and indistinct foci of cerebral white matter enhancement identified. A 4 mm area of enhancement at the right corona radiata is seen on series 12, image 33. And two similar areas of enhancement are noted in the left periatrial white matter  on series 14, images 17 and 18. Additionally, there is patchy solid versus discontinuous enhancement in the right dorsal brainstem lesion (series 14, image 12). No superimposed dural thickening. Orbit and cervical spine details are reported separately. IMPRESSION: 1. Several small and indistinct foci of enhancement corresponding to some of the cerebral white matter lesions and the right dorsal brainstem lesion demonstrated earlier today. This constellation is most suggestive of acute on chronic demyelinating disease. 2. Orbit and cervical MRI reported separately. Electronically Signed   By: Odessa Fleming M.D.   On: 11/12/2019 18:38   MR Cervical Spine W or Wo Contrast  Result Date: 11/12/2019 CLINICAL DATA:  34 year old male with ataxia, visual changes, right facial droop, numbness. Noncontrast MRI earlier today suspicious for demyelinating disease, including abnormal signal in the dorsal brainstem. EXAM: MRI CERVICAL SPINE WITHOUT AND WITH CONTRAST TECHNIQUE: Multiplanar and multiecho pulse sequences of the cervical spine, to include the craniocervical junction and cervicothoracic junction, were obtained without and with intravenous contrast. CONTRAST:  68mL GADAVIST GADOBUTROL 1 MMOL/ML IV SOLN in conjunction with contrast enhanced imaging of the brain and orbits reported separately. COMPARISON:  Brain MRI earlier today. FINDINGS: Alignment: Preserved cervical lordosis. Vertebrae: No marrow edema or evidence of acute osseous abnormality. Visualized bone marrow signal is within normal limits. Cord: Normal cervical and upper thoracic spinal cord signal and morphology. No cord demyelinating disease identified. No abnormal intradural enhancement. No dural thickening. Posterior Fossa, vertebral arteries, paraspinal tissues: Cervicomedullary junction is within normal limits. Dorsal right brainstem lesion with enhancement again noted, reported separately. Preserved major vascular flow voids in the neck. Codominant  appearing vertebral arteries. Negative visible neck soft tissues. Negative right lung apex. Disc levels: No significant degenerative changes, multilevel mild left side cervical endplate spurring. IMPRESSION: Normal for age MRI of the cervical spine. No evidence of cervical spinal cord demyelination. Electronically Signed   By: Odessa Fleming M.D.   On: 11/12/2019 18:45   ECHOCARDIOGRAM COMPLETE  Result Date: 11/14/2019    ECHOCARDIOGRAM REPORT   Patient Name:   KAMDIN KENON Date of Exam: 11/14/2019 Medical Rec #:  [id number]      Height:       73.0 in Accession #:    [id number]     Weight:       263.8 lb Date of Birth:  1986-06-05      BSA:          2.420 m Patient Age:    33 years       BP:           111/69 mmHg Patient Gender: M              HR:           74 bpm. Exam Location:  Inpatient Procedure: 2D Echo Indications:    794.31 Abnormal ECG  History:        Patient has no prior history of Echocardiogram examinations.                 Risk Factors:Current Smoker.  Sonographer:    Celene Skeen RDCS (AE) Referring Phys: [id number] Gardiner Ramus  Charman Blasco IMPRESSIONS  1. Left ventricular ejection fraction, by estimation, is 60 to 65%. The left ventricle has normal function. The left ventricle has no regional wall motion abnormalities. Left ventricular diastolic parameters were normal.  2. Right ventricular systolic function is normal. The right ventricular size is normal.  3. Right atrial size was mildly dilated.  4. The mitral valve is normal in structure. No evidence of mitral valve regurgitation. No evidence of mitral stenosis.  5. The aortic valve is normal in structure. Aortic valve regurgitation is not visualized. No aortic stenosis is present. FINDINGS  Left Ventricle: Left ventricular ejection fraction, by estimation, is 60 to 65%. The left ventricle has normal function. The left ventricle has no regional wall motion abnormalities. The left ventricular internal cavity size was normal in size. There is  no left  ventricular hypertrophy. Left ventricular diastolic parameters were normal. Right Ventricle: The right ventricular size is normal. No increase in right ventricular wall thickness. Right ventricular systolic function is normal. Left Atrium: Left atrial size was normal in size. Right Atrium: Right atrial size was mildly dilated. Pericardium: There is no evidence of pericardial effusion. Mitral Valve: The mitral valve is normal in structure. No evidence of mitral valve regurgitation. No evidence of mitral valve stenosis. Tricuspid Valve: The tricuspid valve is normal in structure. Tricuspid valve regurgitation is not demonstrated. Aortic Valve: The aortic valve is normal in structure. Aortic valve regurgitation is not visualized. No aortic stenosis is present. Pulmonic Valve: The pulmonic valve was grossly normal. Pulmonic valve regurgitation is not visualized. Aorta: The aortic root and ascending aorta are structurally normal, with no evidence of dilitation. IAS/Shunts: The atrial septum is grossly normal.  LEFT VENTRICLE PLAX 2D LVIDd:         5.40 cm  Diastology LVIDs:         3.00 cm  LV e' medial:   16.10 cm/s LV PW:         1.10 cm  LV E/e' medial: 8.1 LV IVS:        1.00 cm LVOT diam:     2.00 cm LV SV:         99 LV SV Index:   41 LVOT Area:     3.14 cm  RIGHT VENTRICLE RV S prime:     14.80 cm/s TAPSE (M-mode): 3.7 cm LEFT ATRIUM           Index       RIGHT ATRIUM           Index LA diam:      4.10 cm 1.69 cm/m  RA Area:     19.70 cm LA Vol (A4C): 29.6 ml 12.23 ml/m RA Volume:   49.30 ml  20.37 ml/m  AORTIC VALVE LVOT Vmax:   163.00 cm/s LVOT Vmean:  92.600 cm/s LVOT VTI:    0.315 m  AORTA Ao Root diam: 2.90 cm MITRAL VALVE MV Area (PHT): 3.91 cm     SHUNTS MV Decel Time: 194 msec     Systemic VTI:  0.32 m MV E velocity: 131.00 cm/s  Systemic Diam: 2.00 cm Kristeen Miss MD Electronically signed by Kristeen Miss MD Signature Date/Time: 11/14/2019/4:00:43 PM    Final    MR ORBITS W WO CONTRAST  Result  Date: 11/12/2019 CLINICAL DATA:  34 year old male with ataxia, visual changes, right facial droop, numbness. Noncontrast MRI earlier today suspicious for demyelinating disease, including abnormal signal in the dorsal brainstem. EXAM: MRI OF THE ORBITS WITHOUT AND WITH  CONTRAST TECHNIQUE: Multiplanar, multisequence MR imaging of the orbits was performed both before and after the administration of intravenous contrast. CONTRAST:  10mL GADAVIST GADOBUTROL 1 MMOL/ML IV SOLN in conjunction with contrast enhanced imaging of the brain and cervical spine reported separately. COMPARISON:  Brain MRI today. FINDINGS: Normal suprasellar cistern. Pituitary is within normal limits. No cavernous sinus abnormality identified. Normal optic chiasm. Pre chiasmatic optic nerves appear symmetric and within normal limits. No definite optic nerve enhancement. There is mildly Disconjugate gaze, but otherwise the globes and other intraorbital soft tissues appears symmetric and within normal limits. No intraorbital mass or inflammation. Superficial periorbital soft tissues are within normal limits. Visible major vascular flow voids at the skull base are preserved. Visible internal auditory structures appear normal. No abnormal IAC enhancement. Visualized bone marrow signal is within normal limits. Mildly nodular enhancement at the dorsal right brainstem lesion again noted on series 10, image 4. IMPRESSION: 1. MRI appearance of both orbits within normal limits. 2. Abnormal enhancement of the brain parenchyma, see head MRI with contrast reported separately. Electronically Signed   By: Odessa Fleming M.D.   On: 11/12/2019 18:42    (Echo, Carotid, EGD, Colonoscopy, ERCP)    Subjective: Patient resting in bed listening to music No new complaints Continues to have eye symptoms blurriness and double vision  Discharge Exam: Vitals:   11/16/19 0221 11/16/19 0536  BP: 126/71 121/76  Pulse:  (!) 50  Resp:  14  Temp:  97.7 F (36.5 C)  SpO2:   99%   Vitals:   11/15/19 1410 11/15/19 2112 11/16/19 0221 11/16/19 0536  BP: 122/60 130/64 126/71 121/76  Pulse: (!) 55 (!) 53  (!) 50  Resp: Temp: 98 F (36.7 C) 98.5 F (36.9 C)  97.7 F (36.5 C)  TempSrc: Oral Oral  Oral  SpO2: 95% 97%  99%  Weight:      Height:        General: Pt is alert, awake, not in acute distress Cardiovascular: RRR, S1/S2 +, no rubs, no gallops Respiratory: CTA bilaterally, no wheezing, no rhonchi Abdominal: Soft, NT, ND, bowel sounds + Extremities: no edema, no cyanosis    The results of significant diagnostics from this hospitalization (including imaging, microbiology, ancillary and laboratory) are listed below for reference.     Microbiology: Recent Results (from the past 240 hour(s))  SARS CORONAVIRUS 2 (TAT 6-24 HRS) Nasopharyngeal Nasopharyngeal Swab     Status: None   Collection Time: 11/12/19  8:53 PM   Specimen: Nasopharyngeal Swab  Result Value Ref Range Status   SARS Coronavirus 2 NEGATIVE NEGATIVE Final    Comment: (NOTE) SARS-CoV-2 target nucleic acids are NOT DETECTED. The SARS-CoV-2 RNA is generally detectable in upper and lower respiratory specimens during the acute phase of infection. Negative results do not preclude SARS-CoV-2 infection, do not rule out co-infections with other pathogens, and should not be used as the sole basis for treatment or other patient management decisions. Negative results must be combined with clinical observations, patient history, and epidemiological information. The expected result is Negative. Fact Sheet for Patients: HairSlick.no Fact Sheet for Healthcare Providers: quierodirigir.com This test is not yet approved or cleared by the [place] FDA and  has been authorized for detection and/or diagnosis of SARS-CoV-2 by FDA under an Emergency Use Authorization (EUA). This EUA will remain  in effect (meaning this test can be  used) for the duration of the COVID-19 declaration under Section 56 4(b)(1) of the  Act, 21 U.S.C. section 360bbb-3(b)(1), unless the authorization is terminated or revoked sooner. Performed at [facility], [address] [place], [place] [address]      Labs: BNP (last 3 results) No results for input(s): BNP in the last 8760 hours. Basic Metabolic Panel: Recent Labs  Lab 11/12/19 0900 11/13/19 0610  NA 138 137  K 4.0 4.3  CL 105 105  CO2 23 23  GLUCOSE 106* 168*  BUN 20 17  CREATININE 0.97 0.85  CALCIUM 9.0 9.4   Liver Function Tests: No results for input(s): AST, ALT, ALKPHOS, BILITOT, PROT, ALBUMIN in the last 168 hours. No results for input(s): LIPASE, AMYLASE in the last 168 hours. No results for input(s): AMMONIA in the last 168 hours. CBC: Recent Labs  Lab 11/12/19 0900 11/13/19 0610  WBC 12.3* 10.7*  HGB 17.5* 16.8  HCT 53.8* 50.2  MCV 90.1 88.2  PLT 166 175   Cardiac Enzymes: No results for input(s): CKTOTAL, CKMB, CKMBINDEX, TROPONINI in the last 168 hours. BNP: Invalid input(s): POCBNP CBG: Recent Labs  Lab 11/12/19 0908  GLUCAP 99   D-Dimer No results for input(s): DDIMER in the last 72 hours. Hgb A1c No results for input(s): HGBA1C in the last 72 hours. Lipid Profile No results for input(s): CHOL, HDL, LDLCALC, TRIG, CHOLHDL, LDLDIRECT in the last 72 hours. Thyroid function studies No results for input(s): TSH, T4TOTAL, T3FREE, THYROIDAB in the last 72 hours.  Invalid input(s): FREET3 Anemia work up No results for input(s): VITAMINB12, FOLATE, FERRITIN, TIBC, IRON, RETICCTPCT in the last 72 hours. Urinalysis    Component Value Date/Time   COLORURINE AMBER (A) 11/12/2019 1128   APPEARANCEUR CLEAR 11/12/2019 1128   LABSPEC 1.027 11/12/2019 1128   PHURINE 6.0 11/12/2019 1128   GLUCOSEU NEGATIVE 11/12/2019 1128   HGBUR NEGATIVE 11/12/2019 1128   BILIRUBINUR NEGATIVE 11/12/2019 1128   KETONESUR NEGATIVE 11/12/2019 1128    PROTEINUR NEGATIVE 11/12/2019 1128   NITRITE NEGATIVE 11/12/2019 1128   LEUKOCYTESUR NEGATIVE 11/12/2019 1128   Sepsis Labs Invalid input(s): PROCALCITONIN,  WBC,  LACTICIDVEN Microbiology Recent Results (from the past 240 hour(s))  SARS CORONAVIRUS 2 (TAT 6-24 HRS) Nasopharyngeal Nasopharyngeal Swab     Status: None   Collection Time: 11/12/19  8:53 PM   Specimen: Nasopharyngeal Swab  Result Value Ref Range Status   SARS Coronavirus 2 NEGATIVE NEGATIVE Final    Comment: (NOTE) SARS-CoV-2 target nucleic acids are NOT DETECTED. The SARS-CoV-2 RNA is generally detectable in upper and lower respiratory specimens during the acute phase of infection. Negative results do not preclude SARS-CoV-2 infection, do not rule out co-infections with other pathogens, and should not be used as the sole basis for treatment or other patient management decisions. Negative results must be combined with clinical observations, patient history, and epidemiological information. The expected result is Negative. Fact Sheet for Patients: HairSlick.no Fact Sheet for Healthcare Providers: quierodirigir.com This test is not yet approved or cleared by the [place] FDA and  has been authorized for detection and/or diagnosis of SARS-CoV-2 by FDA under an Emergency Use Authorization (EUA). This EUA will remain  in effect (meaning this test can be used) for the duration of the COVID-19 declaration under Section 56 4(b)(1) of the Act, 21 U.S.C. section 360bbb-3(b)(1), unless the authorization is terminated or revoked sooner. Performed at [facility], [address], [place], [place] [address]      Time coordinating discharge:  38 minutes  SIGNED:   Alwyn Ren, MD  [facility] 11/16/2019, 8:04 AM Pager   If 7PM-7AM, please contact night-coverage [email or web address] Password [email or web address]

## 2019-11-16 NOTE — TOC Transition Note (Signed)
Transition of Care [facility]) - CM/SW Discharge Note   Patient Details  Name: Joseph Thomas MRN: [id number] Date of Birth: [birth year]  Transition of Care [facility] CM/SW Contact:  Ida Rogue, LCSW Phone Number: 11/16/2019, 9:50 AM   Clinical Narrative:   Confirmed with patient that he was wanting appointment with provider her in [place]; appointment scheduled.  No other needs identified. TOC sign off.    Final next level of care: Home/Self Care Barriers to Discharge: No Barriers Identified   Patient Goals and CMS Choice Patient states their goals for this hospitalization and ongoing recovery are:: Return home   Choice offered to / list presented to : NA  Discharge Placement                       Discharge Plan and Services     Post Acute Care Choice: NA                               Social Determinants of Health (SDOH) Interventions     Readmission Risk Interventions Readmission Risk Prevention Plan 11/15/2019  Transportation Screening Complete  Some recent data might be hidden

## 2019-11-25 NOTE — Progress Notes (Signed)
Patient ID: Joseph Thomas, male   DOB: August 12, 1986, 34 y.o.   MRN: [id number]    Joseph Thomas, is a 34 y.o. male  EQA:[id number]  [id number]:[id number]  DOB - 05-Sep-1985  Subjective:  Chief Complaint and HPI: Joseph Thomas is a 34 y.o. male here today to establish care and for a follow up visit After hospitalization 3/25-3/29/2021 with a new diagnosis of MS.  He has not made an appt with Dr Bonnita Hollow office today and thought that is what today's appt was.  He had been healthy up until now.  He is feeling much better overall but still having some vision disturbance.  Gait and balance have improved.  Appetite is good.    From discharge summary: Recommendations for Outpatient Follow-up:  1. Follow up with PCP in 1-2 weeks 2. Please obtain BMP/CBC in one week 3. Please follow up with Dr. Epimenio Foot neurologist please call for appointment  Home Health: None Equipment/Devices: None Discharge Condition: Stable and improved CODE STATUS: Full code  diet recommendation: Cardiac  Brief/Interim Summary:33 y.o.malewithtobacco use and no other known medical problems who presents with concerns of worsening vision changes and ataxia.  About 2 weeks ago, he was just sitting in his truck when he suddenly developed right-sided facial numbness and drooping. He then called EMS and was told to follow-up with his PCP. He saw his PCP and was diagnosed with Bell's palsy and was started on prednisone and amoxicillin. His facial numbness/drooping resolved with the prednisone but he also noticed progressively worsening vision changes. His visions have been blurry, cloudy and some diplopia. He also noticed balance issues when he was asked to do heel-to-toe at his PCPs office. Has not had any falls. 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  He endorsed tobacco use of 1 pack/day for the past 7 to 8 years. Denies any alcohol or illicit drug use. Patient works as a  [employer]al architect. Denies any chemical exposures. Family history "mother, brother and sister otherwise healthy. Dad has diabetes. Denies any history of neurological disorders.   ED Course:He was afebrile, normotensive on room air. CBC shows leukocytosis of 12.3, hemoglobin of 17.5. Glucose of 106 and all other electrolytes normal.  MRI brain without contrast shows nonspecific T2 hyperintense white matter lesions. MRI brain with contrast showed several small and indistinct foci of enhancement.  MRI of both orbits within normal limits. Normal cervical spine MRI.Marland Kitchen   Discharge Diagnoses:  Principal Problem:   Multiple sclerosis (HCC) Active Problems:   Ataxia   Hyperglycemia   Leukocytosis   Tobacco user   #1 ataxia with vision changes likely secondary to multiple sclerosis and wanted to have syndrome-patient admitted with ataxia and changes with his vision.  MRI of the brain-several small indistinct foci of enhancement corresponding to some of the cerebral white matter lesions and the right dorsal brainstem lesion most suggestive of acute on chronic demyelinating disease. MRI orbits normal MRI C-spine no active or acute changes urine drug screen negative He was seen by neurology and recommended to treat him with Solu-Medrol 1 g daily for 5 days. He received all 5 doses of Solu-Medrol. He reports no improvement in his eye symptoms. ANA SSA SSBacehave been sent and pending. F/u dr sader as out patient He is a Naval architect by profession.  I have advised him not to drive.  #2 tobacco abuse advised cessation  #3 steroid-induced hyperglycemia hemoglobin A1c is 5.5 he was treated with sliding scale insulin  during the hospital stay.  #4 bradycardia asymptomatic-echoLeft ventricular ejection fraction, by estimation, is 60 to 65%. The  left ventricle has normal function. The left ventricle has no regional wall motion abnormalities. Left ventricular diastolic parameters  were normal. Right ventricular systolic function is normal. The right ventricular  size is normal.  Right atrial size was mildly dilated.  The mitral valve is normal in structure. No evidence of mitral valve  regurgitation. No evidence of mitral stenosis.  The aortic valve is normal in structure. Aortic valve regurgitation is  not visualized. No aortic stenosis is present.   ED/Hospital notes reviewed.   Social History:  Truck driver Family history:  No MS/neurological diseases in family  ROS:   Constitutional:  No f/c, No night sweats, No unexplained weight loss. EENT:  No hearing changes. No mouth, throat, or ear problems.  Respiratory: No cough, No SOB Cardiac: No CP, no palpitations GI:  No abd pain, No N/V/D. GU: No Urinary s/sx Musculoskeletal: No joint pain Neuro: No headache, some dizziness, no motor weakness.  Skin: No rash Endocrine:  No polydipsia. No polyuria.  Psych: Denies SI/HI  No problems updated.  ALLERGIES: No Known Allergies  PAST MEDICAL HISTORY: History reviewed. No pertinent past medical history.  MEDICATIONS AT HOME: Prior to Admission medications   Medication Sig Start Date End Date Taking? Authorizing Provider  nicotine (NICODERM CQ - DOSED IN MG/24 HOURS) 14 mg/24hr patch Place 1 patch (14 mg total) onto the skin daily. Patient not taking: Reported on 11/26/2019 11/16/19   Georgette Shell, MD     Objective:  EXAM:   Vitals:   11/26/19 1047  BP: 113/75  Pulse: 85  Temp: 97.7 F (36.5 C)  TempSrc: Temporal  SpO2: 99%  Weight: 268 lb (121.6 kg)  Height: 6\' 2"  (1.88 m)    General appearance : A&OX3. NAD. Non-toxic-appearing HEENT: Atraumatic and Normocephalic.  PERRLA. EOM intact.  Chest/Lungs:  Breathing-non-labored, Good air entry bilaterally, breath sounds normal without rales, rhonchi, or wheezing  CVS: S1 S2 regular, no murmurs, gallops, rubs  Psych:  TP linear. J/I WNL. Normal speech. Appropriate eye contact and affect.    Skin:  No Rash  Data Review Lab Results  Component Value Date   HGBA1C 5.5 11/13/2019     Assessment & Plan   1. Multiple sclerosis ([place]) Make appt with Dr Marcha Dutton provided and highlighted on AVS - Comprehensive metabolic panel  2. Leukocytosis, unspecified type Likely due to steroids in hospital - CBC with Differential/Platelet  3. Hyperglycemia I have had a lengthy discussion and provided education about insulin resistance and the intake of too much sugar/refined carbohydrates.  I have advised the patient to work at a goal of eliminating sugary drinks, candy, desserts, sweets, refined sugars, processed foods, and white carbohydrates.  The patient expresses understanding.  - Comprehensive metabolic panel  4. Ataxia resolving  5. Hospital discharge follow-up improving   Patient have been counseled extensively about nutrition and exercise  Return in about 2 months (around 01/26/2020) for assign PCP.  The patient was given clear instructions to go to ER or return to medical center if symptoms don't improve, worsen or new problems develop. The patient verbalized understanding. The patient was told to call to get lab results if they haven't heard anything in the next week.     Freeman Caldron, PA-C [facility] [place], [place]   11/26/2019, 11:05 AM

## 2019-11-26 ENCOUNTER — Other Ambulatory Visit: Payer: Self-pay

## 2019-11-26 ENCOUNTER — Ambulatory Visit: Payer: Self-pay | Attending: Family Medicine | Admitting: Physician Assistant

## 2019-11-26 VITALS — BP 113/75 | HR 85 | Temp 97.7°F | Ht 74.0 in | Wt 268.0 lb

## 2019-11-26 DIAGNOSIS — R739 Hyperglycemia, unspecified: Secondary | ICD-10-CM

## 2019-11-26 DIAGNOSIS — G35D Multiple sclerosis, unspecified: Secondary | ICD-10-CM

## 2019-11-26 DIAGNOSIS — Z09 Encounter for follow-up examination after completed treatment for conditions other than malignant neoplasm: Secondary | ICD-10-CM

## 2019-11-26 DIAGNOSIS — G35 Multiple sclerosis: Secondary | ICD-10-CM

## 2019-11-26 DIAGNOSIS — R270 Ataxia, unspecified: Secondary | ICD-10-CM

## 2019-11-26 DIAGNOSIS — D72829 Elevated white blood cell count, unspecified: Secondary | ICD-10-CM

## 2019-11-26 NOTE — Patient Instructions (Addendum)
DR. Bonnita Hollow office-please call for appointment   [facility] [address] [place] [address] [phone]   Multiple Sclerosis Multiple sclerosis (MS) is a disease of the brain, spinal cord, and optic nerves (central nervous system). It causes the body's disease-fighting (immune) system to destroy the protective covering (myelin sheath) around nerves in the brain. When this happens, signals (nerve impulses) going to and from the brain and spinal cord do not get sent properly or may not get sent at all. There are several types of MS:  Relapsing-remitting MS. This is the most common type. This causes sudden attacks of symptoms. After an attack, you may recover completely until the next attack, or some symptoms may remain permanently.  Secondary progressive MS. This usually develops after the onset of relapsing-remitting MS. Similar to relapsing-remitting MS, this type also causes sudden attacks of symptoms. Attacks may be less frequent, but symptoms slowly get worse (progress) over time.  Primary progressive MS. This causes symptoms that steadily progress over time. This type of MS does not cause sudden attacks of symptoms. The age of onset of MS varies, but it often develops between 24-27 years of age. MS is a lifelong (chronic) condition. There is no cure, but treatment can help slow down the progression of the disease. What are the causes? The cause of this condition is not known. What increases the risk? You are more likely to develop this condition if:  You are a woman.  You have a relative with MS. However, the condition is not passed from parent to child (inherited).  You have a lack (deficiency) of vitamin D.  You smoke. MS is more common in the [place] than in the [place]. What are the signs or symptoms? Relapsing-remitting and secondary progressive MS cause symptoms to occur in episodes or attacks that may last weeks to months.  There may be long periods between attacks in which there are almost no symptoms. Primary progressive MS causes symptoms to steadily progress after they develop. Symptoms of MS vary because of the many different ways it affects the central nervous system. The main symptoms include:  Vision problems and eye pain.  Numbness.  Weakness.  Inability to move your arms, hands, feet, or legs (paralysis).  Balance problems.  Shaking that you cannot control (tremors).  Muscle spasms.  Problems with thinking (cognitive changes). MS can also cause symptoms that are associated with the disease, but are not always the direct result of an MS attack. They may include:  Inability to control urination or bowel movements (incontinence).  Headaches.  Fatigue.  Inability to tolerate heat.  Emotional changes.  Depression.  Pain. How is this diagnosed? This condition is diagnosed based on:  Your symptoms.  A neurological exam. This involves checking central nervous system function, such as nerve function, reflexes, and coordination.  MRIs of the brain and spinal cord.  Lab tests, including a lumbar puncture that tests the fluid that surrounds the brain and spinal cord (cerebrospinal fluid).  Tests to measure the electrical activity of the brain in response to stimulation (evoked potentials). How is this treated? There is no cure for MS, but medicines can help decrease the number and frequency of attacks and help relieve nuisance symptoms. Treatment options may include:  Medicines that reduce the frequency of attacks. These medicines may be given by injection, by mouth (orally), or through an IV.  Medicines that reduce inflammation (steroids). These may provide short-term relief of symptoms.  Medicines to help  control pain, depression, fatigue, or incontinence.  Vitamin D, if you have a deficiency.  Using devices to help you move around (assistive devices), such as braces, a cane, or a  walker.  Physical therapy to strengthen and stretch your muscles.  Occupational therapy to help you with everyday tasks.  Alternative or complementary treatments such as exercise, massage, or acupuncture. Follow these instructions at home:  Take over-the-counter and prescription medicines only as told by your health care provider.  Do not drive or use heavy machinery while taking prescription pain medicine.  Use assistive devices as recommended by your physical therapist or your health care provider.  Exercise as directed by your health care provider.  Return to your normal activities as told by your health care provider. Ask your health care provider what activities are safe for you.  Reach out for support. Share your feelings with friends, family, or a support group.  Keep all follow-up visits as told by your health care provider and therapists. This is important. Where to find more information  National Multiple Sclerosis Society: https://www.nationalmssociety.org Contact a health care provider if:  You feel depressed.  You develop new pain or numbness.  You have tremors.  You have problems with sexual function. Get help right away if:  You develop paralysis.  You develop numbness.  You have problems with your bladder or bowel function.  You develop double vision.  You lose vision in one or both eyes.  You develop suicidal thoughts.  You develop severe confusion. If you ever feel like you may hurt yourself or others, or have thoughts about taking your own life, get help right away. You can go to your nearest emergency department or call:  Your local emergency services (911 in the [place]  A suicide crisis helpline, such as the [facility] at 754-309-1911. This is open 24 hours a day. Summary  Multiple sclerosis (MS) is a disease of the central nervous system that causes the body's immune system to destroy the protective covering  (myelin sheath) around nerves in the brain.  There are 3 types of MS: relapsing-remitting, secondary progressive, and primary progressive. Relapsing-remitting and secondary progressive MS cause symptoms to occur in episodes or attacks that may last weeks to months. Primary progressive MS causes symptoms to steadily progress after they develop.  There is no cure for MS, but medicines can help decrease the number and frequency of attacks and help relieve nuisance symptoms. Treatment may also include physical or occupational therapy.  If you develop numbness, paralysis, vision problems, or other neurological symptoms, get help right away. This information is not intended to replace advice given to you by your health care provider. Make sure you discuss any questions you have with your health care provider. Document Revised: 07/19/2017 Document Reviewed: 10/15/2016 Elsevier Patient Education  2020 [employer].

## 2019-11-27 LAB — COMPREHENSIVE METABOLIC PANEL
ALT: 31 IU/L (ref 0–44)
AST: 18 IU/L (ref 0–40)
Albumin/Globulin Ratio: 2 (ref 1.2–2.2)
Albumin: 4.2 g/dL (ref 4.0–5.0)
Alkaline Phosphatase: 82 IU/L (ref 39–117)
BUN/Creatinine Ratio: 14 (ref 9–20)
BUN: 15 mg/dL (ref 6–20)
Bilirubin Total: 0.3 mg/dL (ref 0.0–1.2)
CO2: 23 mmol/L (ref 20–29)
Calcium: 9.4 mg/dL (ref 8.7–10.2)
Chloride: 102 mmol/L (ref 96–106)
Creatinine, Ser: 1.04 mg/dL (ref 0.76–1.27)
GFR calc Af Amer: 109 mL/min/{1.73_m2} (ref >59)
GFR calc non Af Amer: 94 mL/min/{1.73_m2} (ref >59)
Globulin, Total: 2.1 g/dL (ref 1.5–4.5)
Glucose: 101 mg/dL — ABNORMAL HIGH (ref 65–99)
Potassium: 4.5 mmol/L (ref 3.5–5.2)
Sodium: 139 mmol/L (ref 134–144)
Total Protein: 6.3 g/dL (ref 6.0–8.5)

## 2019-11-27 LAB — CBC WITH DIFFERENTIAL/PLATELET
Basophils Absolute: 0 10*3/uL (ref 0.0–0.2)
Basos: 0 %
EOS (ABSOLUTE): 0.2 10*3/uL (ref 0.0–0.4)
Eos: 2 %
Hematocrit: 49.1 % (ref 37.5–51.0)
Hemoglobin: 16.2 g/dL (ref 13.0–17.7)
Immature Grans (Abs): 0.1 10*3/uL (ref 0.0–0.1)
Immature Granulocytes: 1 %
Lymphocytes Absolute: 1.9 10*3/uL (ref 0.7–3.1)
Lymphs: 21 %
MCH: 29.1 pg (ref 26.6–33.0)
MCHC: 33 g/dL (ref 31.5–35.7)
MCV: 88 fL (ref 79–97)
Monocytes Absolute: 0.6 10*3/uL (ref 0.1–0.9)
Monocytes: 7 %
Neutrophils Absolute: 6.2 10*3/uL (ref 1.4–7.0)
Neutrophils: 69 %
Platelets: 148 10*3/uL — ABNORMAL LOW (ref 150–450)
RBC: 5.57 x10E6/uL (ref 4.14–5.80)
RDW: 13.5 % (ref 11.6–15.4)
WBC: 9.1 10*3/uL (ref 3.4–10.8)

## 2019-12-09 ENCOUNTER — Other Ambulatory Visit: Payer: Self-pay

## 2019-12-09 ENCOUNTER — Encounter: Payer: Self-pay | Admitting: Neurology

## 2019-12-09 ENCOUNTER — Ambulatory Visit: Payer: Self-pay | Admitting: Neurology

## 2019-12-09 VITALS — BP 116/65 | HR 69 | Temp 97.6°F | Ht 74.0 in | Wt 271.6 lb

## 2019-12-09 DIAGNOSIS — G35 Multiple sclerosis: Secondary | ICD-10-CM

## 2019-12-09 DIAGNOSIS — R270 Ataxia, unspecified: Secondary | ICD-10-CM

## 2019-12-09 DIAGNOSIS — R29810 Facial weakness: Secondary | ICD-10-CM | POA: Insufficient documentation

## 2019-12-09 DIAGNOSIS — H518 Other specified disorders of binocular movement: Secondary | ICD-10-CM | POA: Insufficient documentation

## 2019-12-09 NOTE — Progress Notes (Addendum)
[facility]  PATIENT: Joseph Thomas DOB: 1985-12-02  REFERRING DOCTOR OR PCP:  Syble Creek, NP SOURCE: Patient, notes from recent hospitalization, imaging and lab reports, MRI images personally reviewed.  _________________________________   HISTORICAL  CHIEF COMPLAINT:  Chief Complaint  Patient presents with  . New Patient (Initial Visit)    RM 34 with mother, Gavin Pound (temp: 96.9) Internal referral from Syble Creek, NP for MS. Was at [facility] 11/12/19-11/16/19. Received IV solumedrolx5 days. Went in with ataxia, vision changes. He is still having vision issues. Right worse than left. If he moves head to the side, vision gets worse. Worse as the day goes on. Balance better, no falls. No numbness/tingling. Having more joint pain, this is new. Did not have this in the hospital. It worsens if he tries to exercise.     HISTORY OF PRESENT ILLNESS:  I had the pleasure of seeing your patient, Joseph Thomas, at the MS center at [facility] for neurologic consultation regarding his recent diagnosis of multiple sclerosis.  He is a 34 year old man who had the onset of a right facial droop in early March 2021 followed by right eye movement abnormalities (dysconjugate on lateral gaze and nystagmus) and reduced balance a few days later.   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     He was prescribed a steroid pack and amoxicillin.   His face actually improved but then a few days later the imbalance and EOM issues arose.    He went to the [facility] on 11/12/19 and had multiple MRI's c/w MS.    He was admitted and received 5 days of IV Solu-medrol.    His balance improved while in the hospital and vision has gradually improved over the last couple weeks.  It is still not at baseline.  He notes diplopia more when he looks one way or the other, especially to the right, moves quickly or when  playing golf.     Currently, gait and balance are baseline and normal.   Strength is normal.   Bladder function is fine.   He feels a little more fatigued.  He sleeps well.   He denies depression or cognitive changes.  He notes that vision is fine when he uses 1 eye or the other but will have diplopia when he looks to the right with both eyes open  If he uses his eyes more, he notes a headache and eye pain.  He also has noted some joint pain that has started more recently.  DATA REVIEWED MRI scans from 11/12/2019 were personally reviewed: MRIs of the brain, orbits and cervical spine from November 12, 2019:  The MRI of the brain shows a T2 hyperintense focus in the right posterior pons adjacent to the fourth ventricle and several T2/FLAIR hyperintense foci in the hemispheres.  After contrast, the focus in the pons and 3 of the periventricular foci enhance.  The MRI of the orbits was normal.  The MRI of the cervical spine was normal.  Laboratory test: HIV, SSA, SSB, ACE, hemoglobin A1c, anti-Jo 1, ENA, tox screen were negative or normal.  CBC with differential showed a borderline platelet count of 148   REVIEW OF SYSTEMS: Constitutional: No fevers, chills, sweats, or change in appetite Eyes: As above Ear, nose and throat: No hearing loss, ear pain, nasal congestion, sore throat Cardiovascular: No chest pain, palpitations  Respiratory: No shortness of breath at rest or with exertion.   No wheezes GastrointestinaI: No nausea, vomiting, diarrhea, abdominal pain, fecal incontinence Genitourinary: No dysuria, urinary retention or frequency.  No nocturia. Musculoskeletal:   He notes some joint pain Integumentary: No rash, pruritus, skin lesions Neurological: as above Psychiatric: No depression at this time.  No anxiety Endocrine: No palpitations, diaphoresis, change in appetite, change in weigh or increased thirst Hematologic/Lymphatic: No anemia, purpura, petechiae. Allergic/Immunologic: No  itchy/runny eyes, nasal congestion, recent allergic reactions, rashes  ALLERGIES: No Known Allergies  HOME MEDICATIONS:  Current Outpatient Medications:  .  doxycycline (MONODOX) 100 MG capsule, Take 100 mg by mouth daily. , Disp: , Rfl:   PAST MEDICAL HISTORY: History reviewed. No pertinent past medical history.  PAST SURGICAL HISTORY: Past Surgical History:  Procedure Laterality Date  . LUNG BIOPSY  2008  . TONSILLECTOMY AND ADENOIDECTOMY  1997    FAMILY HISTORY: Family History  Problem Relation Age of Onset  . Diabetes Father     SOCIAL HISTORY:  Social History   Socioeconomic History  . Marital status: Single    Spouse name: Not on file  . Number of children: Not on file  . Years of education: Not on file  . Highest education level: Not on file  Occupational History  . Not on file  Tobacco Use  . Smoking status: Current Every Day Smoker    Packs/day: 1.00    Years: 7.00    Pack years: 7.00    Types: Cigarettes  . Smokeless tobacco: Never Used  Substance and Sexual Activity  . Alcohol use: Never  . Drug use: Never  . Sexual activity: Not on file  Other Topics Concern  . Not on file  Social History Narrative   Right handed    Lives alone   Caffeine use: Diet soday   Social Determinants of Health   Financial Resource Strain:   . Difficulty of Paying Living Expenses:   Food Insecurity:   . Worried About Programme researcher, broadcasting/film/video in the Last Year:   . Barista in the Last Year:   Transportation Needs:   . Freight forwarder (Medical):   Marland Kitchen Lack of Transportation (Non-Medical):   Physical Activity:   . Days of Exercise per Week:   . Minutes of Exercise per Session:   Stress:   . Feeling of Stress :   Social Connections:   . Frequency of Communication with Friends and Family:   . Frequency of Social Gatherings with Friends and Family:   . Attends Religious Services:   . Active Member of Clubs or Organizations:   . Attends Tax inspector Meetings:   Marland Kitchen Marital Status:   Intimate Partner Violence:   . Fear of Current or Ex-Partner:   . Emotionally Abused:   Marland Kitchen Physically Abused:   . Sexually Abused:      PHYSICAL EXAM  Vitals:   12/09/19 0857  BP: 116/65  Pulse: 69  Temp: 97.6 F (36.4 C)  Weight: 271 lb 9.6 oz (123.2 kg)  Height: 6\' 2"  (1.88 m)    Body mass index is 34.87 kg/m.   Hearing Screening   125Hz  250Hz  500Hz  1000Hz  2000Hz  3000Hz  4000Hz  6000Hz  8000Hz   Right ear:           Left ear:             Visual Acuity Screening   Right eye Left eye Both eyes  Without correction: unable-too blurry  20/50 20/100  With correction:     Note: He has glasses but did not bring them today.  General: The patient is well-developed and well-nourished and in no acute distress  HEENT:  Head is [place]/AT.  Sclera are anicteric.  Funduscopic exam shows normal optic discs and retinal vessels.  Neck: No carotid bruits are noted.  The neck is nontender.  Cardiovascular: The heart has a regular rate and rhythm with a normal S1 and S2. There were no murmurs, gallops or rubs.    Skin: Extremities are without rash or  edema.  Musculoskeletal:  Back is nontender  Neurologic Exam  Mental status: The patient is alert and oriented x 3 at the time of the examination. The patient has apparent normal recent and remote memory, with an apparently normal attention span and concentration ability.   Speech is normal.  Cranial nerves: Extraocular movements show reduced abduction of the right eye on right gaze.. Pupils are equal, round, and reactive to light and accomodation.  Visual fields are full.  Facial symmetry is present. There is good facial sensation to soft touch bilaterally.Facial strength is normal.  Trapezius and sternocleidomastoid strength is normal. No dysarthria is noted.  The tongue is midline, and the patient has symmetric elevation of the soft palate. No obvious hearing deficits are noted.  Motor:  Muscle  bulk is normal.   Tone is normal. Strength is  5 / 5 in all 4 extremities.   Sensory: Sensory testing is intact to pinprick, soft touch and vibration sensation in all 4 extremities.  Coordination: Cerebellar testing reveals good finger-nose-finger and heel-to-shin bilaterally.  Gait and station: Station is normal.   Gait is normal. Tandem gait is normal. Romberg is negative.   Reflexes: Deep tendon reflexes are symmetric and normal bilaterally.   Plantar responses are flexor.    DIAGNOSTIC DATA (LABS, IMAGING, TESTING) - I reviewed patient records, labs, notes, testing and imaging myself where available.  Lab Results  Component Value Date   WBC 9.1 11/26/2019   HGB 16.2 11/26/2019   HCT 49.1 11/26/2019   MCV 88 11/26/2019   PLT 148 (L) 11/26/2019      Component Value Date/Time   NA 139 11/26/2019 1108   K 4.5 11/26/2019 1108   CL 102 11/26/2019 1108   CO2 23 11/26/2019 1108   GLUCOSE 101 (H) 11/26/2019 1108   GLUCOSE 168 (H) 11/13/2019 0610   BUN 15 11/26/2019 1108   CREATININE 1.04 11/26/2019 1108   CALCIUM 9.4 11/26/2019 1108   PROT 6.3 11/26/2019 1108   ALBUMIN 4.2 11/26/2019 1108   AST 18 11/26/2019 1108   ALT 31 11/26/2019 1108   ALKPHOS 82 11/26/2019 1108   BILITOT 0.3 11/26/2019 1108   GFRNONAA 94 11/26/2019 1108   GFRAA 109 11/26/2019 1108   No results found for: CHOL, HDL, LDLCALC, LDLDIRECT, TRIG, CHOLHDL Lab Results  Component Value Date   HGBA1C 5.5 11/13/2019       ASSESSMENT AND PLAN  Multiple sclerosis (HCC)  Ataxia  Dysconjugate gaze  Weakness on right side of face  In summary, Mr. Mcsweeney is a 34 year old man who had facial weakness, gaze disturbance and ataxia last month.  The MRI is consistent with MS and the presence of enhancing lesions help to confirm the diagnosis using the McDonald criteria.  Although some of his symptoms have improved he continues to have disconjugate gaze upon looking to the right, likely due to involvement of the  right 6th nerve tract within the  brainstem.  To help prevent future lesions, he needs to begin a disease modifying therapy.  We spent a long time discussing various options.  He is most interested in one of the pill options.  In more detail, we discussed Vumerity and Zeposia and drugs in the same categories.  He currently does not have medical insurance.  Although we can usually get free drug for patients through the pharmaceutical companies, that was not helpful coverage for lab work and MRI studies.  We are running an open label study with the posterior and we could get at least 2 years of free drug as well as over the studies if he enrolls.  He is good to give this some thought.  If he prefers not to do a study we can get him started on 1 of those 2 medications but would need to check some blood work for [place]  He will return to see me in 3 months or sooner for new or worsening symptoms or based on whether or not he enters the drug study.  Thank you for asking me to see Mr. Durkin.  Please let me know if I can be of further assistance with him or other patients in the future.   Shree Espey A. Felecia Shelling, MD, [facility] 10/25/6576, 4:69 PM Certified in Neurology, Clinical Neurophysiology, Sleep Medicine and Neuroimaging  [facility] [address], [place], [place] [address] [phone]

## 2019-12-29 ENCOUNTER — Other Ambulatory Visit: Payer: Self-pay | Admitting: Neurology

## 2019-12-29 DIAGNOSIS — G35 Multiple sclerosis: Secondary | ICD-10-CM

## 2020-01-28 ENCOUNTER — Other Ambulatory Visit: Payer: Self-pay | Admitting: Neurology

## 2020-01-28 MED ORDER — PREDNISONE 50 MG PO TABS: ORAL_TABLET | ORAL | 0 refills | Status: DC

## 2020-02-19 ENCOUNTER — Ambulatory Visit: Payer: Self-pay | Admitting: Internal Medicine

## 2020-10-19 ENCOUNTER — Other Ambulatory Visit: Payer: Self-pay | Admitting: Neurology

## 2020-10-19 DIAGNOSIS — G35 Multiple sclerosis: Secondary | ICD-10-CM

## 2021-01-11 ENCOUNTER — Telehealth: Payer: Self-pay | Admitting: Neurology

## 2021-01-11 NOTE — Telephone Encounter (Signed)
He is doing well.   No recent exacerbation  EDSS 2.0 Vision 2   (20/40 OD and 20/20 OS) Cerebellar 1  (tandem gait) Cerebral 1 (cognition) Others 0

## 2021-01-18 ENCOUNTER — Telehealth: Payer: Self-pay | Admitting: *Deleted

## 2021-01-18 NOTE — Telephone Encounter (Signed)
Request made for cd mri head. 

## 2021-01-23 ENCOUNTER — Telehealth: Payer: Self-pay | Admitting: *Deleted

## 2021-01-23 NOTE — Telephone Encounter (Signed)
R/c cd from [facility]. Cd on Emma desk. 

## 2021-12-19 ENCOUNTER — Other Ambulatory Visit: Payer: Self-pay | Admitting: Neurology

## 2021-12-19 DIAGNOSIS — G35 Multiple sclerosis: Secondary | ICD-10-CM

## 2022-01-08 ENCOUNTER — Telehealth: Payer: Self-pay | Admitting: Neurology

## 2022-01-08 NOTE — Telephone Encounter (Signed)
Joseph Thomas returns today for a study visit (Enlighten study).  He is on ozanimod.  He denies any exacerbation or significant new neurologic symptoms.  He does report that fatigue and memory issues are bothering him more than last year.  He notes no difficulty with walking or other physical tasks.  EDSS today was 2.5 scoring 2 on the visual scale, 2 on the cognitive scale (mild memory and processing issues sometimes affect his performance.  He also has fatigue).  Other scales were 0.  We discussed considering adding modafinil and he will think about this.  He is advised to eat well and try to get 7 hours of sleep.  He should take vitamin D over-the-counter supplements.

## 2022-01-10 ENCOUNTER — Telehealth: Payer: Self-pay | Admitting: Neurology

## 2022-01-10 NOTE — Telephone Encounter (Signed)
Received a report from [facility] for the results of the MRI of the brain completed on 01/08/2022 Impression: There are a few small foci of T2 hyperintensity within the periventricular white matter of both hemispheres. They do not enhance. The lesions are unchanged compared with the 01/17/2021 study, but fewer lesions are evident and the lesions are less conspicuous compared with the study from Jan 08, 2020   Will send to Dr Epimenio Foot for to review and result for the pt.

## 2022-03-03 ENCOUNTER — Other Ambulatory Visit: Payer: Self-pay | Admitting: Neurology

## 2022-03-03 MED ORDER — OZANIMOD HCL 0.92 MG PO CAPS: ORAL_CAPSULE | ORAL | Status: DC

## 2022-10-18 ENCOUNTER — Telehealth: Payer: Self-pay | Admitting: *Deleted

## 2022-10-18 NOTE — Telephone Encounter (Signed)
Pt currently in ozanimod research study here at [facility]. He will be out of study end of May 2024. He will need visit with Dr. Felecia Shelling.   Please call pt to offer 11/20/22 at 4pm with Dr. Felecia Shelling. Also, please ask patient to come in to sign Zeposia start form either this week or next week. Also, please ask him to bring updated insurance cards to scan in/make copies for [place] to send in with the start form.

## 2022-10-22 NOTE — Telephone Encounter (Signed)
Called pt and informed him of message nurse [facility] sent. I scheduled pt for appointment on 4/2 at 4 pm with Dr. Felecia Shelling. Pt stated he doesn't have any insurance at the time but stated he would come sometime this week to fill out application for Zeposia.

## 2022-10-22 NOTE — Telephone Encounter (Signed)
Noted  

## 2022-10-29 NOTE — Telephone Encounter (Signed)
Pt came by and signed Zeposia start form today. Faxed completed/signed Zeposia start form to [employer] at [phone]. Received fax confirmation.  Asked they screen for free drug since patient has no insurance.

## 2022-11-05 ENCOUNTER — Other Ambulatory Visit: Payer: Self-pay | Admitting: Neurology

## 2022-11-05 DIAGNOSIS — G35 Multiple sclerosis: Secondary | ICD-10-CM

## 2022-11-05 NOTE — Telephone Encounter (Signed)
Izora Gala called from [place] [employer]. Stated she need pt to print name and date birth under the authorization and agreement signature.  Stated pt can go online and e-sign the form.

## 2022-11-05 NOTE — Telephone Encounter (Signed)
Resent start form w/ pt name/DOB on auth form. Pt already signed/dated. Received fax confirmation.

## 2022-11-06 ENCOUNTER — Other Ambulatory Visit: Payer: Self-pay | Admitting: Neurology

## 2022-11-06 DIAGNOSIS — G35 Multiple sclerosis: Secondary | ICD-10-CM

## 2022-11-19 NOTE — Telephone Encounter (Addendum)
Called Zeposia [employer] at [phone]. Spoke w/ Lexine Baton. On 11/06/22, referred pt to assistance program after pt reported he has no insurance/unable to afford medication cost. She advised me to call [place] [facility]. Spoke w/ [employer]. They have not received application from pt yet.   I called pt at [phone]. He has not completed application for [facility]. I emailed him form at [email or web address]. He will complete and bring with him tomorrow when he comes for appt. Aware we will complete our portion as well.

## 2022-11-20 ENCOUNTER — Ambulatory Visit (INDEPENDENT_AMBULATORY_CARE_PROVIDER_SITE_OTHER): Payer: Self-pay | Admitting: Neurology

## 2022-11-20 ENCOUNTER — Encounter: Payer: Self-pay | Admitting: Neurology

## 2022-11-20 VITALS — BP 138/82 | HR 61 | Ht 73.0 in | Wt 264.2 lb

## 2022-11-20 DIAGNOSIS — H518 Other specified disorders of binocular movement: Secondary | ICD-10-CM

## 2022-11-20 DIAGNOSIS — R270 Ataxia, unspecified: Secondary | ICD-10-CM

## 2022-11-20 DIAGNOSIS — G35 Multiple sclerosis: Secondary | ICD-10-CM

## 2022-11-20 NOTE — Progress Notes (Signed)
[facility]  PATIENT: Joseph Thomas DOB: 1986-03-24  REFERRING DOCTOR OR PCP:  Lucita Lora, NP SOURCE: Patient, notes from recent hospitalization, imaging and lab reports, MRI images personally reviewed.  _________________________________   HISTORICAL  CHIEF COMPLAINT:  Chief Complaint  Patient presents with   Follow-up    Pt in room 10, here for MS follow up. Pt has joint pain should. Neck, knees, feel fatigue for last 6 months.     HISTORY OF PRESENT ILLNESS:  Joseph Thomas is a 37 y.o. man with relapsing remitting multiple sclerosis.  Currently, gait and balance are baseline and normal.  No issues with going up or downstairs.     Strength is normal.   Bladder function is fine.   He feels a little more fatigued.  He sleeps well.   He denies depression or cognitive changes.  He notes that vision is fine when he uses 1 eye or the other but will have diplopia when he looks to the right with both eyes open   He still notes that if he is over something wavy like on a boat that he gets dizzy when he looks at the water.   Also if something passes him quickly he feels he is no focusing well on it.    He has more fatigue despite good sleep.   He feels a little apathetic.  He denies depression.    He used to work out but now does not.  He still plays golf many weekends.   If he uses his eyes more, he notes a headache and eye pain.  He also has noted some joint pain that has started more recently.  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     MS History:  He is a 37 year old man who had the onset of a right facial droop in early March 2021 followed by right eye movement abnormalities (dysconjugate on lateral gaze and nystagmus) and reduced balance a few days later.   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     He was prescribed a steroid pack and  amoxicillin.   His face actually improved but then a few days later the imbalance and EOM issues arose.    He went to the [facility] ED on 11/12/19 and had multiple MRI's c/w MS.    He was admitted and received 5 days of IV Solu-medrol.    His balance improved while in the hospital and vision has gradually improved over the last couple weeks.  It is still not at baseline.  He notes diplopia more when he looks one way or the other, especially to the right, moves quickly or when playing golf.      DATA REVIEWED MRI scans from 11/12/2019 were personally reviewed: MRIs of the brain, orbits and cervical spine from November 12, 2019:  The MRI of the brain shows a T2 hyperintense focus in the right posterior pons adjacent to the fourth ventricle and several T2/FLAIR hyperintense foci in the hemispheres.  After contrast, the focus in the pons and 3 of the periventricular foci enhance.  The MRI of the orbits was normal.  The MRI of the cervical spine was normal.  Laboratory test: HIV, SSA, SSB, ACE, hemoglobin A1c, anti-Jo 1, ENA, tox screen were negative or normal.  CBC with differential showed a borderline platelet  count of 148   REVIEW OF SYSTEMS: Constitutional: No fevers, chills, sweats, or change in appetite Eyes: As above Ear, nose and throat: No hearing loss, ear pain, nasal congestion, sore throat Cardiovascular: No chest pain, palpitations Respiratory:  No shortness of breath at rest or with exertion.   No wheezes GastrointestinaI: No nausea, vomiting, diarrhea, abdominal pain, fecal incontinence Genitourinary:  No dysuria, urinary retention or frequency.  No nocturia. Musculoskeletal:    He notes some joint pain Integumentary: No rash, pruritus, skin lesions Neurological: as above Psychiatric: No depression at this time.  No anxiety Endocrine: No palpitations, diaphoresis, change in appetite, change in weigh or increased thirst Hematologic/Lymphatic:  No anemia, purpura,  petechiae. Allergic/Immunologic: No itchy/runny eyes, nasal congestion, recent allergic reactions, rashes  ALLERGIES: No Known Allergies  HOME MEDICATIONS:  Current Outpatient Medications:    Ozanimod HCl 0.92 MG CAPS, One pill po qd, Disp: 30 capsule, Rfl:    doxycycline (MONODOX) 100 MG capsule, Take 100 mg by mouth daily.  (Patient not taking: Reported on 11/20/2022), Disp: , Rfl:    predniSONE (DELTASONE) 50 MG tablet, 12 pills (600 mg) po daily x 3 days.   High dose prednisone for MS exacerbation (Patient not taking: Reported on 11/20/2022), Disp: 36 tablet, Rfl: 0  PAST MEDICAL HISTORY: No past medical history on file.  PAST SURGICAL HISTORY: Past Surgical History:  Procedure Laterality Date   LUNG BIOPSY  2008   TONSILLECTOMY AND ADENOIDECTOMY  1997    FAMILY HISTORY: Family History  Problem Relation Age of Onset   Diabetes Father     SOCIAL HISTORY:  Social History   Socioeconomic History   Marital status: Single    Spouse name: Not on file   Number of children: Not on file   Years of education: Not on file   Highest education level: Not on file  Occupational History   Not on file  Tobacco Use   Smoking status: Every Day    Packs/day: 1.00    Years: 7.00    Additional pack years: 0.00    Total pack years: 7.00    Types: Cigarettes   Smokeless tobacco: Never  Substance and Sexual Activity   Alcohol use: Never   Drug use: Never   Sexual activity: Not on file  Other Topics Concern   Not on file  Social History Narrative   Right handed    Lives alone   Caffeine use: Diet soday   Social Determinants of Health   Financial Resource Strain: Not on file  Food Insecurity: Not on file  Transportation Needs: Not on file  Physical Activity: Not on file  Stress: Not on file  Social Connections: Not on file  Intimate Partner Violence: Not on file     PHYSICAL EXAM  Vitals:   11/20/22 1556  BP: 138/82  Pulse: 61  Weight: 264 lb 3.2 oz (119.8 kg)   Height: 6\' 1"  (1.854 m)    Body mass index is 34.86 kg/m.  Note: He has glasses but did not bring them today.  General: The patient is well-developed and well-nourished and in no acute distress  HEENT:  Head is [place]/AT.  Sclera are anicteric.    Skin: Extremities are without rash or  edema.  Neurologic Exam  Mental status: The patient is alert and oriented x 3 at the time of the examination. The patient has apparent normal recent and remote memory, with an apparently normal attention span and concentration ability.   Speech is normal.  Cranial nerves: Extraocular movements show good eye movements now.  .Facial strength is normal.  Trapezius and sternocleidomastoid strength is normal. No dysarthria is noted.   No obvious hearing deficits are noted.  Motor:  Muscle bulk is normal.   Tone is normal. Strength is  5 / 5 in all 4 extremities.   Sensory: Sensory testing is intact to pinprick, soft touch and vibration sensation in all 4 extremities.  Coordination: Cerebellar testing reveals good finger-nose-finger and heel-to-shin bilaterally.  Gait and station: Station is normal.   Gait is normal. Tandem gait is normal. Romberg is negative.   Reflexes: Deep tendon reflexes are symmetric and normal bilaterally.       DIAGNOSTIC DATA (LABS, IMAGING, TESTING) - I reviewed patient records, labs, notes, testing and imaging myself where available.  Lab Results  Component Value Date   WBC 9.1 11/26/2019   HGB 16.2 11/26/2019   HCT 49.1 11/26/2019   MCV 88 11/26/2019   PLT 148 (L) 11/26/2019      Component Value Date/Time   NA 139 11/26/2019 1108   K 4.5 11/26/2019 1108   CL 102 11/26/2019 1108   CO2 23 11/26/2019 1108   GLUCOSE 101 (H) 11/26/2019 1108   GLUCOSE 168 (H) 11/13/2019 0610   BUN 15 11/26/2019 1108   CREATININE 1.04 11/26/2019 1108   CALCIUM 9.4 11/26/2019 1108   PROT 6.3 11/26/2019 1108   ALBUMIN 4.2 11/26/2019 1108   AST 18 11/26/2019 1108   ALT 31 11/26/2019  1108   ALKPHOS 82 11/26/2019 1108   BILITOT 0.3 11/26/2019 1108   GFRNONAA 94 11/26/2019 1108   GFRAA 109 11/26/2019 1108   No results found for: "CHOL", "HDL", "LDLCALC", "LDLDIRECT", "TRIG", "CHOLHDL" Lab Results  Component Value Date   HGBA1C 5.5 11/13/2019       ASSESSMENT AND PLAN  Multiple sclerosis  Ataxia  Dysconjugate gaze  Continue Zeposia.  He signed a service request form and we will roll him over from study drug to commercial product.  He does not have insurance.  Hopefully we will be able to get free drug.  If he does not qualify we will need to consider other options such as dimethyl fumarate Stay active and exercise as tolerated. He has had some apathy and we discussed trying to exercise more. Return in 6 months or sooner if there are new or worsening neurologic symptoms.  He will have a study visit for the stroke study in about a month and I will see him then.  Additionally he will have an MRI around that time.   Rydge Texidor A. Felecia Shelling, MD, [facility] 0000000, [id number] PM Certified in Neurology, Clinical Neurophysiology, Sleep Medicine and Neuroimaging  [facility] [address], [place], [place] [address] [phone]

## 2022-11-21 NOTE — Telephone Encounter (Signed)
Patient came in for office visit on 12/12/22 and completed the patient assistant form for [employer]. Form faxed to [phone] confirmation received .

## 2022-11-28 NOTE — Telephone Encounter (Signed)
I called patient. He received his Zeposia shipment. He is still taking the research study supply of Zeposia. He will follow up as scheduled through research. Pt had no questions at this time but was encouraged to call back if questions arise.

## 2023-01-10 ENCOUNTER — Telehealth: Payer: Self-pay | Admitting: Neurology

## 2023-01-10 NOTE — Telephone Encounter (Signed)
Joseph Thomas comes in today for the final treatment visit of the enlighten MS study.  He reports no major new issues.  He has noted a little bit of back pain off and on since about 3 weeks ago but has not taken any medication.  He was advised to try over-the-counter medication such as Motrin or Aleve.  If not better I would consider a steroid pack or muscle relaxant.  Neurologically he feels he is stable.  He continues to work full-time.  On the EDSS his goal was 2.5.  He scored a 2 on the vision and a 2 on cerebral due to mild cognitive/processing issues that affect him slightly.  He also notes fatigue other subscales were 0 and the neurologic examination outside of vision was normal  He will complete the other scales and cognitive test this morning.  He was set up an appointment to see me in about 6 months.  He still has the safety visits for the study and will come into the office for those as scheduled

## 2023-01-16 ENCOUNTER — Telehealth: Payer: Self-pay | Admitting: Neurology

## 2023-01-16 NOTE — Telephone Encounter (Signed)
Pt stated he would like for Dr. Epimenio Foot to call him in some Steroids for his back.

## 2023-01-16 NOTE — Telephone Encounter (Signed)
Pt last seen 11/20/2022 Upcoming Appointment 07/02/2023  Please read 01/10/2023 note Steroid pack was mentioned if Motrin or Aleve didn't work.  Called pt and confirmed that he was taking Motrin and Ibuprofen and pt states that it was making his back pain worse. Told pt Dr. Epimenio Foot is out today,but this message will be sent to the [facility] doctor.

## 2023-01-17 ENCOUNTER — Other Ambulatory Visit: Payer: Self-pay | Admitting: Neurology

## 2023-01-17 MED ORDER — METHYLPREDNISOLONE 4 MG PO TABS: ORAL_TABLET | ORAL | 0 refills | Status: AC

## 2023-01-17 MED ORDER — TIZANIDINE HCL 4 MG PO TABS: 4.0000 mg | ORAL_TABLET | Freq: Four times a day (QID) | ORAL | 0 refills | Status: AC | PRN

## 2023-01-17 NOTE — Telephone Encounter (Signed)
I will call him a muscle relaxant. Will forward note to Dr. Epimenio Foot to see if steroids are warranted.

## 2023-01-21 NOTE — Telephone Encounter (Signed)
Called and spoke w/ pt. Relayed Dr. Epimenio Foot called in steroid pack and went over instructions. He verbalized understanding and will pick up from pharmacy today.

## 2023-02-14 ENCOUNTER — Telehealth: Payer: Self-pay | Admitting: Neurology

## 2023-02-14 NOTE — Telephone Encounter (Signed)
He was abe to roll over from study drug to commercial Zeposia and continues to tolerate it well.  No new neurologic symptoms but had midline lower back pain radiating to the buttocks bilaterally last month.  A steroid pack helped more than a muscle relaxant or NSAIDs.  Pain is now much better.  Today, cranial nerves, strength, sensation, coordination and gait were normal.. Fundoscopic exam was normal.   Physical examination was normal.  He will return for final visit in 3 months.

## 2023-04-08 ENCOUNTER — Telehealth: Payer: Self-pay | Admitting: Neurology

## 2023-04-08 NOTE — Telephone Encounter (Signed)
He is here today for his final visit for the enlighten MS trial.  He reports doing well with no new symptoms.  Every now and then he will have some right facial paresthesias but never for more than 1 day.  Physical examination was normal except for mildly reduced visual acuity (20/32 OD and 20/25 OS).  Funduscopic exam was normal.  He was able to successfully transition to commercial Zeposia as his disease modifying therapy.

## 2023-07-02 ENCOUNTER — Encounter: Payer: Self-pay | Admitting: Neurology

## 2023-07-02 ENCOUNTER — Ambulatory Visit (INDEPENDENT_AMBULATORY_CARE_PROVIDER_SITE_OTHER): Payer: Self-pay | Admitting: Neurology

## 2023-07-02 VITALS — BP 116/72 | HR 57 | Ht 73.0 in | Wt 282.5 lb

## 2023-07-02 DIAGNOSIS — G35 Multiple sclerosis: Secondary | ICD-10-CM

## 2023-07-02 DIAGNOSIS — R270 Ataxia, unspecified: Secondary | ICD-10-CM

## 2023-07-02 DIAGNOSIS — H518 Other specified disorders of binocular movement: Secondary | ICD-10-CM

## 2023-07-02 DIAGNOSIS — Z79899 Other long term (current) drug therapy: Secondary | ICD-10-CM

## 2023-07-02 NOTE — Progress Notes (Signed)
[facility]  PATIENT: Joseph Thomas DOB: [birth year]  REFERRING DOCTOR OR PCP:  [place], NP SOURCE: Patient, notes from recent hospitalization, imaging and lab reports, MRI images personally reviewed.  _________________________________   HISTORICAL  CHIEF COMPLAINT:  Chief Complaint  Patient presents with   Room 11    Pt is here Alone. Pt states that things have been about the same since his last appointment. Pt states that he doesn't have any new questions or concerns to discuss today.     HISTORY OF PRESENT ILLNESS:  Joseph Thomas is a 37 y.o. man with relapsing remitting multiple sclerosis.  UPDATE 07/02/2023: He is on Zeposia and tolerates it well.  No exacerbations but fatigue is a little worse.   CBC/D was done as part of Enlighteb drug trial earlier this year and was fine.    Currently, gait and balance are baseline and normal.  No issues with going up or downstairs.     Strength is normal.   Bladder function is fine.   He feels a little more fatigued.  He sleeps well.   He denies depression or cognitive changes.  He notes that vision is fine when he uses 1 eye or the other but will have diplopia when he looks to the right with both eyes open   He still notes that if he is over something wavy like on a boat that he gets dizzy when he looks at the water.   Also if something passes him quickly he feels he is no focusing well on it.    He has more fatigue despite good sleep.   Fatigue is worse as day goes on or in heat.     He notes mild depression at times and some apathy.   He used to work out but now does not due to fatigue.   He sleeps well most nights.     He was working as a Naval architect in the past, mostly local routes.     He has gained more weight the last year.      MS History: He had the onset of a right facial droop in early March 2021 followed by right eye movement abnormalities (dysconjugate on lateral gaze and nystagmus) and reduced  balance a few days later.   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     He was prescribed a steroid pack and amoxicillin.   His face actually improved but then a few days later the imbalance and EOM issues arose.    He went to the [facility] on 11/12/19 and had multiple MRI's c/w MS.    He was admitted and received 5 days of IV Solu-medrol.    His balance improved while in the hospital and vision has gradually improved over the last couple weeks.  It is still not at baseline.  He notes diplopia more when he looks one way or the other, especially to the right, moves quickly  He started [place] in 2021.      DATA REVIEWED MRI 01/15/2023, was unchanged  MRI 01/08/2022 unchanged  MRIs of the brain, orbits and cervical spine from November 12, 2019:  The MRI of the brain shows a T2 hyperintense focus in the right posterior pons adjacent to the fourth ventricle and several T2/FLAIR hyperintense foci in the hemispheres.  After contrast, the focus in the pons and 3 of  the periventricular foci enhance.  The MRI of the orbits was normal.  The MRI of the cervical spine was normal.  Laboratory tests 2021 HIV, SSA, SSB, ACE, hemoglobin A1c, anti-Jo 1, ENA, tox screen were negative or normal.  CBC with differential showed a borderline platelet count of 148   REVIEW OF SYSTEMS: Constitutional: No fevers, chills, sweats, or change in appetite Eyes: As above Ear, nose and throat: No hearing loss, ear pain, nasal congestion, sore throat Cardiovascular: No chest pain, palpitations Respiratory:  No shortness of breath at rest or with exertion.   No wheezes GastrointestinaI: No nausea, vomiting, diarrhea, abdominal pain, fecal incontinence Genitourinary:  No dysuria, urinary retention or frequency.  No nocturia. Musculoskeletal:    He notes some joint pain Integumentary: No rash, pruritus, skin lesions Neurological: as  above Psychiatric: No depression at this time.  No anxiety Endocrine: No palpitations, diaphoresis, change in appetite, change in weigh or increased thirst Hematologic/Lymphatic:  No anemia, purpura, petechiae. Allergic/Immunologic: No itchy/runny eyes, nasal congestion, recent allergic reactions, rashes  ALLERGIES: No Known Allergies  HOME MEDICATIONS:  Current Outpatient Medications:    Ozanimod HCl 0.92 MG CAPS, One pill po qd, Disp: 30 capsule, Rfl:    methylPREDNISolone (MEDROL) 4 MG tablet, Taper from 6 pills po for one day to 1 pill po the last day over 6 days (Patient not taking: Reported on 07/02/2023), Disp: 21 tablet, Rfl: 0   tiZANidine (ZANAFLEX) 4 MG tablet, Take 1 tablet (4 mg total) by mouth every 6 (six) hours as needed for muscle spasms. (Patient not taking: Reported on 07/02/2023), Disp: 30 tablet, Rfl: 0  PAST MEDICAL HISTORY: History reviewed. No pertinent past medical history.  PAST SURGICAL HISTORY: Past Surgical History:  Procedure Laterality Date   LUNG BIOPSY  2008   TONSILLECTOMY AND ADENOIDECTOMY  1997    FAMILY HISTORY: Family History  Problem Relation Age of Onset   Diabetes Father     SOCIAL HISTORY:  Social History   Socioeconomic History   Marital status: Single    Spouse name: Not on file   Number of children: Not on file   Years of education: Not on file   Highest education level: Not on file  Occupational History   Not on file  Tobacco Use   Smoking status: Every Day    Current packs/day: 1.00    Average packs/day: 1 pack/day for 7.0 years (7.0 ttl pk-yrs)    Types: Cigarettes   Smokeless tobacco: Never  Substance and Sexual Activity   Alcohol use: Never   Drug use: Never   Sexual activity: Not on file  Other Topics Concern   Not on file  Social History Narrative   Right handed    Lives with Mom and Dad   Caffeine use: Diet soda everyday   Social Determinants of Health   Financial Resource Strain: Not on file  Food  Insecurity: Not on file  Transportation Needs: Not on file  Physical Activity: Not on file  Stress: Not on file  Social Connections: Unknown (12/19/2021)   Received from [facility], [facility]   Social Network    Social Network: Not on file  Intimate Partner Violence: Unknown (11/24/2021)   Received from [facility], [facility]   HITS    Physically Hurt: Not on file    Insult or Talk Down To: Not on file    Threaten Physical Harm: Not on file    Scream or Curse: Not on file  PHYSICAL EXAM  Vitals:   07/02/23 1552  BP: 116/72  Pulse: (!) 57  Weight: 282 lb 8 oz (128.1 kg)  Height: 6\' 1"  (1.854 m)    Body mass index is 37.27 kg/m.  Note: He has glasses but did not bring them today.  General: The patient is well-developed and well-nourished and in no acute distress  HEENT:  Head is [place]/AT.  Sclera are anicteric.    Skin: Extremities are without rash or  edema.  Neurologic Exam  Mental status: The patient is alert and oriented x 3 at the time of the examination. The patient has apparent normal recent and remote memory, with an apparently normal attention span and concentration ability.   Speech is normal.  Cranial nerves: Extraocular movements show good eye movements now.  .Facial strength is normal.  Trapezius and sternocleidomastoid strength is normal. No dysarthria is noted.   No obvious hearing deficits are noted.  Motor:  Muscle bulk is normal.   Tone is normal. Strength is  5 / 5 in all 4 extremities.   Sensory: Sensory testing is intact to pinprick, soft touch and vibration sensation in all 4 extremities.  Coordination: Cerebellar testing reveals good finger-nose-finger and heel-to-shin bilaterally.  Gait and station: Station is normal.   Gait is near normal but tandem gait is wide   Romberg is negative.   Reflexes: Deep tendon reflexes are symmetric and normal bilaterally.       DIAGNOSTIC DATA (LABS, IMAGING, TESTING) - I reviewed patient  records, labs, notes, testing and imaging myself where available.  Lab Results  Component Value Date   WBC 9.1 11/26/2019   HGB 16.2 11/26/2019   HCT 49.1 11/26/2019   MCV 88 11/26/2019   PLT 148 (L) 11/26/2019      Component Value Date/Time   NA 139 11/26/2019 1108   K 4.5 11/26/2019 1108   CL 102 11/26/2019 1108   CO2 23 11/26/2019 1108   GLUCOSE 101 (H) 11/26/2019 1108   GLUCOSE 168 (H) 11/13/2019 0610   BUN 15 11/26/2019 1108   CREATININE 1.04 11/26/2019 1108   CALCIUM 9.4 11/26/2019 1108   PROT 6.3 11/26/2019 1108   ALBUMIN 4.2 11/26/2019 1108   AST 18 11/26/2019 1108   ALT 31 11/26/2019 1108   ALKPHOS 82 11/26/2019 1108   BILITOT 0.3 11/26/2019 1108   GFRNONAA 94 11/26/2019 1108   GFRAA 109 11/26/2019 1108   No results found for: "CHOL", "HDL", "LDLCALC", "LDLDIRECT", "TRIG", "CHOLHDL" Lab Results  Component Value Date   HGBA1C 5.5 11/13/2019       ASSESSMENT AND PLAN  Multiple sclerosis (HCC)  Ataxia  Dysconjugate gaze  High risk medication use   Continue Zeposia.    Stay active and exercise as tolerated. He has had some apathy and we discussed trying to exercise more.   He prefers not to be on an antidepressant Consider provigil for MS related fatigue.  Would prefer not to be on a controlled substance Return in 6 months or sooner if there are new or worsening neurologic symptoms.      Sharyl Panchal A. Epimenio Foot, MD, [facility] 07/02/2023, 4:12 PM Certified in Neurology, Clinical Neurophysiology, Sleep Medicine and Neuroimaging  [facility] [address], [address] [place], [place] [address] [phone]

## 2023-10-28 ENCOUNTER — Other Ambulatory Visit: Payer: Self-pay | Admitting: Neurology

## 2024-01-27 ENCOUNTER — Encounter: Payer: Self-pay | Admitting: Neurology

## 2024-01-27 ENCOUNTER — Ambulatory Visit (INDEPENDENT_AMBULATORY_CARE_PROVIDER_SITE_OTHER): Payer: Self-pay | Admitting: Neurology

## 2024-01-27 VITALS — BP 124/78 | HR 64 | Ht 73.0 in | Wt 274.8 lb

## 2024-01-27 DIAGNOSIS — G35 Multiple sclerosis: Secondary | ICD-10-CM

## 2024-01-27 DIAGNOSIS — H518 Other specified disorders of binocular movement: Secondary | ICD-10-CM

## 2024-01-27 DIAGNOSIS — Z79899 Other long term (current) drug therapy: Secondary | ICD-10-CM

## 2024-01-27 NOTE — Progress Notes (Signed)
 [facility]  PATIENT: Joseph Thomas DOB: June 22, 1986  REFERRING DOCTOR OR PCP:  Ruben Corolla, NP SOURCE: Patient, notes from recent hospitalization, imaging and lab reports, MRI images personally reviewed.  _________________________________   HISTORICAL  CHIEF COMPLAINT:  Chief Complaint  Patient presents with   Follow-up    Pt in room 11.alone. Here for MS follow up, on Zeposia. Pt reports being stable, pt does reports lots of fatigue. Last eye exam 2-3 years.    HISTORY OF PRESENT ILLNESS:  Joseph Thomas is a 38 y.o. man with relapsing remitting multiple sclerosis.  UPDATE 01/27/2024: He is on Zeposia and tolerates it well.  No exacerbations but fatigue is a little worse.   CBC/D was done as part of Enlighteb drug trial earlier this year and was fine.     He does not have insurance  Currently, gait and balance are baseline and normal.  No issues with going up or downstairs.    Strength is normal.   Rarely, the right foot seems numb.  Bladder function is fine.   He feels a little more fatigued.  He sleeps well.   He denies depression or cognitive changes.    He denies frank diplopia anymore but will sometimes have blurry vision.  If something passes him quickly he feels he is no focusing well on it.    He has more fatigue despite good sleep.   Fatigue is worse as day goes on or in heat.     He had mild depression at times and some apathy.   This is better than last year.  He used to work out but now does not due to fatigue.   He sleeps well most nights.     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     He occasionally gets a right periorbital headache.   These last 4 to 6 hours at times.  Sometimes pressure like. He generally does not take any medication.       MS History: He had the onset of a right facial droop in early March 2021 followed by right eye movement abnormalities (dysconjugate on lateral gaze and nystagmus) and reduced  balance a few days later.   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     He was prescribed a steroid pack and amoxicillin.   His face actually improved but then a few days later the imbalance and EOM issues arose.    He went to the [facility] on 11/12/19 and had multiple MRI's c/w MS.    He was admitted and received 5 days of IV Solu-medrol .    His balance improved while in the hospital and vision has gradually improved over the last couple weeks.  It is still not at baseline.  He notes diplopia more when he looks one way or the other, especially to the right, moves quickly  He started Zeposia in 2021.      DATA REVIEWED MRI 01/15/2023, was unchanged  MRI 01/08/2022 unchanged  MRIs of the brain, orbits and cervical spine from November 12, 2019:  The MRI of the brain shows a T2 hyperintense focus in the right posterior pons adjacent to the fourth ventricle and several T2/FLAIR hyperintense foci in the hemispheres.  After contrast, the focus in the pons and 3 of the periventricular foci enhance.  The MRI of the  orbits was normal.  The MRI of the cervical spine was normal.  Laboratory tests 2021 HIV, SSA, SSB, ACE, hemoglobin A1c, anti-Jo 1, ENA, tox screen were negative or normal.  CBC with differential showed a borderline platelet count of 148   REVIEW OF SYSTEMS: Constitutional: No fevers, chills, sweats, or change in appetite Eyes: As above Ear, nose and throat: No hearing loss, ear pain, nasal congestion, sore throat Cardiovascular: No chest pain, palpitations Respiratory:  No shortness of breath at rest or with exertion.   No wheezes GastrointestinaI: No nausea, vomiting, diarrhea, abdominal pain, fecal incontinence Genitourinary:  No dysuria, urinary retention or frequency.  No nocturia. Musculoskeletal:    He notes some joint pain Integumentary: No rash, pruritus, skin lesions Neurological: as  above Psychiatric: No depression at this time.  No anxiety Endocrine: No palpitations, diaphoresis, change in appetite, change in weigh or increased thirst Hematologic/Lymphatic:  No anemia, purpura, petechiae. Allergic/Immunologic: No itchy/runny eyes, nasal congestion, recent allergic reactions, rashes  ALLERGIES: No Known Allergies  HOME MEDICATIONS:  Current Outpatient Medications:    ZEPOSIA 0.92 MG CAPS, TAKE 1 CAPSULE BY MOUTH ONCE DAILY, Disp: 90 capsule, Rfl: 0   methylPREDNISolone  (MEDROL ) 4 MG tablet, Taper from 6 pills po for one day to 1 pill po the last day over 6 days (Patient not taking: Reported on 01/27/2024), Disp: 21 tablet, Rfl: 0   tiZANidine  (ZANAFLEX ) 4 MG tablet, Take 1 tablet (4 mg total) by mouth every 6 (six) hours as needed for muscle spasms. (Patient not taking: Reported on 01/27/2024), Disp: 30 tablet, Rfl: 0  PAST MEDICAL HISTORY: History reviewed. No pertinent past medical history.  PAST SURGICAL HISTORY: Past Surgical History:  Procedure Laterality Date   LUNG BIOPSY  2008   TONSILLECTOMY AND ADENOIDECTOMY  1997    FAMILY HISTORY: Family History  Problem Relation Age of Onset   Diabetes Father     SOCIAL HISTORY:  Social History   Socioeconomic History   Marital status: Single    Spouse name: Not on file   Number of children: Not on file   Years of education: Not on file   Highest education level: Not on file  Occupational History   Not on file  Tobacco Use   Smoking status: Former    Current packs/day: 0.00    Average packs/day: 1 pack/day for 7.0 years (7.0 ttl pk-yrs)    Types: Cigarettes    Quit date: 01/26/2022    Years since quitting: 2.0   Smokeless tobacco: Current    Types: Snuff  Vaping Use   Vaping status: Never Used  Substance and Sexual Activity   Alcohol use: Never   Drug use: Never   Sexual activity: Not on file  Other Topics Concern   Not on file  Social History Narrative   Right handed    Lives with Mom and  Dad   Caffeine use: Diet soda everyday   Social Drivers of Health   Financial Resource Strain: Not on file  Food Insecurity: Not on file  Transportation Needs: Not on file  Physical Activity: Not on file  Stress: Not on file  Social Connections: Unknown (12/19/2021)   Received from [facility], [facility]   Social Network    Social Network: Not on file  Intimate Partner Violence: Unknown (11/24/2021)   [facility] from [facility], [facility]   HITS    Physically Hurt: Not on file    Insult or Talk Down To: Not on file  Threaten Physical Harm: Not on file    Scream or Curse: Not on file     PHYSICAL EXAM  Vitals:   01/27/24 1058  BP: 124/78  Pulse: 64  Weight: 274 lb 12.8 oz (124.6 kg)  Height: 6\' 1"  (1.854 m)    Body mass index is 36.26 kg/m.  Note: He has glasses but did not bring them today.  General: The patient is well-developed and well-nourished and in no acute distress  HEENT:  Head is [place]/AT.  Sclera are anicteric.    Skin: Extremities are without rash or  edema.  Neurologic Exam  Mental status: The patient is alert and oriented x 3 at the time of the examination. The patient has apparent normal recent and remote memory, with an apparently normal attention span and concentration ability.   Speech is normal.  Cranial nerves: Extraocular muscles appear to be intact no diplopia.Aaron Aas  Aaron AasFacial strength is normal.  Trapezius and sternocleidomastoid strength is normal. No dysarthria is noted.   No obvious hearing deficits are noted.  Motor:  Muscle bulk is normal.   Tone is normal. Strength is  5 / 5 in all 4 extremities.   Sensory: Sensory testing is intact to pinprick, soft touch and vibration sensation in all 4 extremities.  Coordination: Cerebellar testing reveals good finger-nose-finger and heel-to-shin bilaterally.  Gait and station: Station is normal.   The gait was normal.  Tandem gait was mildly wide Romberg is negative.   Reflexes: Deep tendon  reflexes are symmetric and normal bilaterally.       DIAGNOSTIC DATA (LABS, IMAGING, TESTING) - I reviewed patient records, labs, notes, testing and imaging myself where available.  Lab Results  Component Value Date   WBC 9.1 11/26/2019   HGB 16.2 11/26/2019   HCT 49.1 11/26/2019   MCV 88 11/26/2019   PLT 148 (L) 11/26/2019      Component Value Date/Time   NA 139 11/26/2019 1108   K 4.5 11/26/2019 1108   CL 102 11/26/2019 1108   CO2 23 11/26/2019 1108   GLUCOSE 101 (H) 11/26/2019 1108   GLUCOSE 168 (H) 11/13/2019 0610   BUN 15 11/26/2019 1108   CREATININE 1.04 11/26/2019 1108   CALCIUM 9.4 11/26/2019 1108   PROT 6.3 11/26/2019 1108   ALBUMIN 4.2 11/26/2019 1108   AST 18 11/26/2019 1108   ALT 31 11/26/2019 1108   ALKPHOS 82 11/26/2019 1108   BILITOT 0.3 11/26/2019 1108   GFRNONAA 94 11/26/2019 1108   GFRAA 109 11/26/2019 1108   No results found for: "CHOL", "HDL", "LDLCALC", "LDLDIRECT", "TRIG", "CHOLHDL" Lab Results  Component Value Date   HGBA1C 5.5 11/13/2019       ASSESSMENT AND PLAN  Multiple sclerosis (HCC) - Plan: CBC with Differential/Platelet  High risk medication use - Plan: CBC with Differential/Platelet  Dysconjugate gaze   Continue Zeposia.   He will contact the company to make sure he is still eligible for free drug.  We discussed there are a couple of inexpensive options if he is unable to get free drug, though I would prefer him to stay on Zeposia since he is stable.  Check CBC/D Stay active and exercise as tolerated Consider provigil for MS related fatigue.  Would prefer not to be on a controlled substance Return in 6 months or sooner if there are new or worsening neurologic symptoms.      Sadrac Zeoli A. Godwin Lat, MD, [facility] 01/27/2024, 11:27 AM Certified in Neurology, Clinical Neurophysiology, Sleep Medicine and Neuroimaging  [facility] [address], [address] [place], [place] [address] [phone]

## 2024-01-28 ENCOUNTER — Ambulatory Visit: Payer: Self-pay | Admitting: Neurology

## 2024-01-28 LAB — CBC WITH DIFFERENTIAL/PLATELET
Basophils Absolute: 0 10*3/uL (ref 0.0–0.2)
Basos: 1 %
EOS (ABSOLUTE): 0.1 10*3/uL (ref 0.0–0.4)
Eos: 3 %
Hematocrit: 47.6 % (ref 37.5–51.0)
Hemoglobin: 15.9 g/dL (ref 13.0–17.7)
Immature Grans (Abs): 0 10*3/uL (ref 0.0–0.1)
Immature Granulocytes: 0 %
Lymphocytes Absolute: 0.5 10*3/uL — ABNORMAL LOW (ref 0.7–3.1)
Lymphs: 11 %
MCH: 30.2 pg (ref 26.6–33.0)
MCHC: 33.4 g/dL (ref 31.5–35.7)
MCV: 91 fL (ref 79–97)
Monocytes Absolute: 0.5 10*3/uL (ref 0.1–0.9)
Monocytes: 10 %
Neutrophils Absolute: 3.2 10*3/uL (ref 1.4–7.0)
Neutrophils: 75 %
Platelets: 181 10*3/uL (ref 150–450)
RBC: 5.26 x10E6/uL (ref 4.14–5.80)
RDW: 13.1 % (ref 11.6–15.4)
WBC: 4.3 10*3/uL (ref 3.4–10.8)

## 2024-01-28 NOTE — Telephone Encounter (Signed)
-----   Message from Jorie Newness sent at 01/28/2024 10:18 AM EDT ----- Please let him know that the blood work was okay.  The lymphocytes are a little bit low but that is typical with Zeposia

## 2024-01-28 NOTE — Telephone Encounter (Signed)
 I called patient. I discussed his labs with him. He will let us  know if he has questions or concerns. Pt verbalized understanding of results. Pt had no questions at this time but was encouraged to call back if questions arise.

## 2024-03-30 ENCOUNTER — Telehealth: Payer: Self-pay | Admitting: Neurology

## 2024-03-30 NOTE — Telephone Encounter (Signed)
 I called pt at [phone]. He wanted to know if he needs to write on his application that we are turning in our portion separate. I recommended he does this. Aware we will write this on our portion we turn in as well. He verbalized understanding.

## 2024-03-30 NOTE — Telephone Encounter (Signed)
 Last refill sent 10/29/23 #90 to: [facility] - [place], [place] - [address], [place] [address] Phone: [phone]  Fax: [phone]    I called Janeane that works with pt assistance and spoke w/ [place].  Last refilled 10/30/23 #90. However, his coverage ended 11/20/23. He needs to be re-enrolled into program w/ [employer]. Phone: [phone]. Updated application needs to be filled out: [email or web address]  I called pt at [phone]. Had extra pills on hand from research study he was in prior. Has about 13 pills left now.  I emailed application above to him at [email or web address]. He will complete and turn in ASAP. Aware we will complete prescriber portion and turn in.  Prescriber portion of application filled out, waiting on MD signature

## 2024-03-30 NOTE — Telephone Encounter (Signed)
 Pt called to get refill on medication that was giving  through [facility] the patient states that he is almost out of tis medication and need MD to see if Md can get Pt medication filled   ZEPOSIA 0.92 MG CAPS

## 2024-03-30 NOTE — Telephone Encounter (Signed)
 Faxed completed/signed form below to [email or web address], received fax confirmation.

## 2024-03-30 NOTE — Telephone Encounter (Signed)
 Pt called back to speak to Nurse again , Pt is requesting a call back just to  ask a  few more question to Nurse

## 2024-04-08 NOTE — Telephone Encounter (Signed)
 Pt has called with concern that he is almost out of medication.  He has sent the forms and even set up Portal thru [facility].  Pt is asking for a call re: any assistance that can be provided

## 2024-04-08 NOTE — Telephone Encounter (Signed)
 Phone room: please call pt back. He needs to call [employer] patient assistance to check on status. We turned our portion in on 03/30/24. They should be able to give him an update on things. Phone: [phone]

## 2024-04-08 NOTE — Telephone Encounter (Signed)
 Noted

## 2024-04-09 ENCOUNTER — Encounter: Payer: Self-pay | Admitting: Neurology

## 2024-04-09 NOTE — Telephone Encounter (Signed)
 SABRA

## 2024-08-27 ENCOUNTER — Ambulatory Visit: Payer: Self-pay | Admitting: Neurology

## 2025-04-08 ENCOUNTER — Ambulatory Visit: Payer: Self-pay | Admitting: Neurology
